# Patient Record
Sex: Male | Born: 1965 | Race: Asian | Hispanic: Refuse to answer | Marital: Married | Smoking: Never smoker
Health system: Southern US, Community
[De-identification: ages and names within clinical notes are randomized; demographics above are authoritative.]

## PROBLEM LIST (undated history)

## (undated) DIAGNOSIS — T7840XA Allergy, unspecified, initial encounter: Secondary | ICD-10-CM

## (undated) DIAGNOSIS — E119 Type 2 diabetes mellitus without complications: Secondary | ICD-10-CM

## (undated) HISTORY — DX: Allergy, unspecified, initial encounter: T78.40XA

## (undated) HISTORY — DX: Type 2 diabetes mellitus without complications: E11.9

---

## 1989-06-10 HISTORY — PX: HERNIA REPAIR: SHX51

## 2012-06-11 ENCOUNTER — Ambulatory Visit: Payer: Self-pay | Admitting: Internal Medicine

## 2012-07-11 ENCOUNTER — Ambulatory Visit: Payer: Self-pay | Admitting: Internal Medicine

## 2012-08-08 ENCOUNTER — Ambulatory Visit: Payer: Self-pay | Admitting: Internal Medicine

## 2012-10-14 ENCOUNTER — Ambulatory Visit: Payer: Self-pay | Admitting: Physician Assistant

## 2012-11-04 ENCOUNTER — Ambulatory Visit: Payer: Self-pay | Admitting: Emergency Medicine

## 2013-09-30 ENCOUNTER — Other Ambulatory Visit: Payer: Self-pay | Admitting: Physician Assistant

## 2013-09-30 LAB — CBC WITH DIFFERENTIAL/PLATELET
Basophil #: 0 10*3/uL (ref 0.0–0.1)
Basophil %: 0.5 %
EOS ABS: 0.2 10*3/uL (ref 0.0–0.7)
Eosinophil %: 2.6 %
HCT: 47 % (ref 40.0–52.0)
HGB: 15.1 g/dL (ref 13.0–18.0)
Lymphocyte #: 2.1 10*3/uL (ref 1.0–3.6)
Lymphocyte %: 29.3 %
MCH: 26.2 pg (ref 26.0–34.0)
MCHC: 32.1 g/dL (ref 32.0–36.0)
MCV: 82 fL (ref 80–100)
MONO ABS: 0.6 x10 3/mm (ref 0.2–1.0)
Monocyte %: 8.2 %
Neutrophil #: 4.2 10*3/uL (ref 1.4–6.5)
Neutrophil %: 59.4 %
PLATELETS: 260 10*3/uL (ref 150–440)
RBC: 5.77 10*6/uL (ref 4.40–5.90)
RDW: 13.3 % (ref 11.5–14.5)
WBC: 7.1 10*3/uL (ref 3.8–10.6)

## 2013-09-30 LAB — COMPREHENSIVE METABOLIC PANEL
ALK PHOS: 72 U/L
Albumin: 3.5 g/dL (ref 3.4–5.0)
Anion Gap: 5 — ABNORMAL LOW (ref 7–16)
BUN: 11 mg/dL (ref 7–18)
Bilirubin,Total: 0.4 mg/dL (ref 0.2–1.0)
CREATININE: 0.67 mg/dL (ref 0.60–1.30)
Calcium, Total: 8.5 mg/dL (ref 8.5–10.1)
Chloride: 104 mmol/L (ref 98–107)
Co2: 31 mmol/L (ref 21–32)
EGFR (African American): >60
EGFR (Non-African Amer.): >60
Glucose: 94 mg/dL (ref 65–99)
OSMOLALITY: 279 (ref 275–301)
POTASSIUM: 4.2 mmol/L (ref 3.5–5.1)
SGOT(AST): 25 U/L (ref 15–37)
SGPT (ALT): 53 U/L (ref 12–78)
SODIUM: 140 mmol/L (ref 136–145)
Total Protein: 7.8 g/dL (ref 6.4–8.2)

## 2013-09-30 LAB — LIPID PANEL
Cholesterol: 104 mg/dL (ref 0–200)
HDL Cholesterol: 31 mg/dL — ABNORMAL LOW (ref 40–60)
Ldl Cholesterol, Calc: 59 mg/dL (ref 0–100)
Triglycerides: 69 mg/dL (ref 0–200)
VLDL Cholesterol, Calc: 14 mg/dL (ref 5–40)

## 2013-09-30 LAB — IRON AND TIBC
IRON: 74 ug/dL (ref 65–175)
Iron Bind.Cap.(Total): 407 ug/dL (ref 250–450)
Iron Saturation: 18 %
UNBOUND IRON-BIND. CAP.: 333 ug/dL

## 2013-09-30 LAB — FERRITIN: Ferritin (ARMC): 38 ng/mL (ref 8–388)

## 2013-09-30 LAB — TSH: Thyroid Stimulating Horm: 1.72 u[IU]/mL

## 2014-08-04 IMAGING — US US PELVIS LIMITED
1 series · 14 of 25 positions shown · non-contrast
Comparison: none

REASON FOR EXAM: left sided testicular pain and swelling
COMMENTS:

[Series 1: us pelvis limited · 0.09mm/px · 14 of 60 slices shown]
[im 1/60]
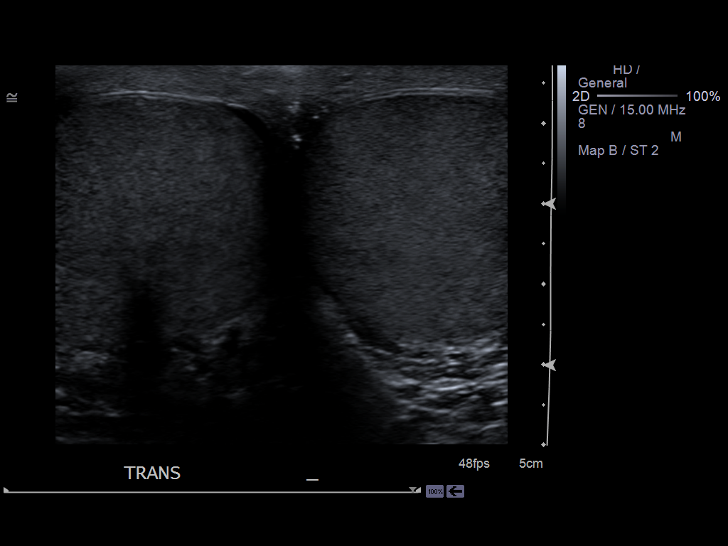
[im 5/60]
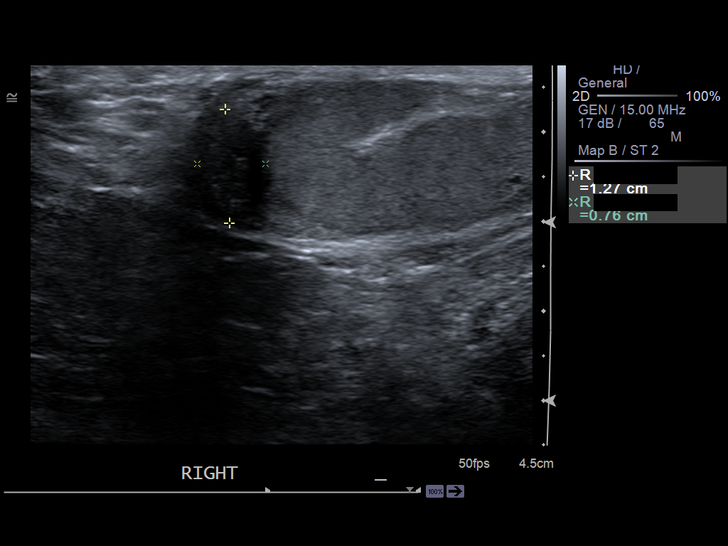
[im 10/60]
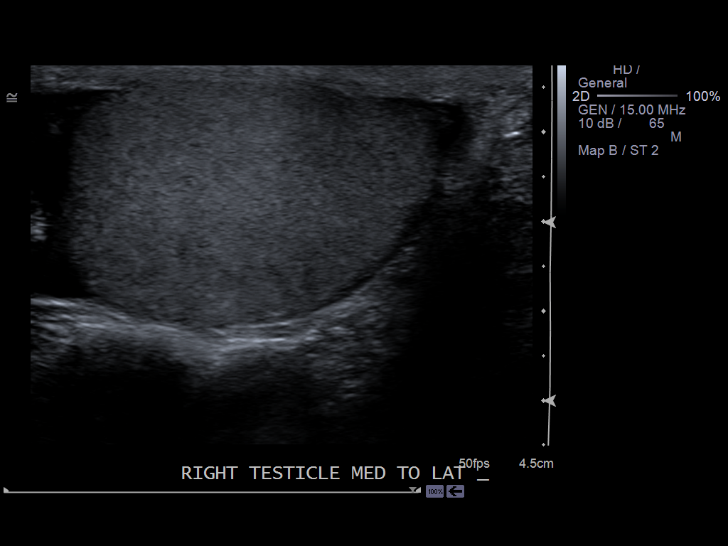
[im 15/60]
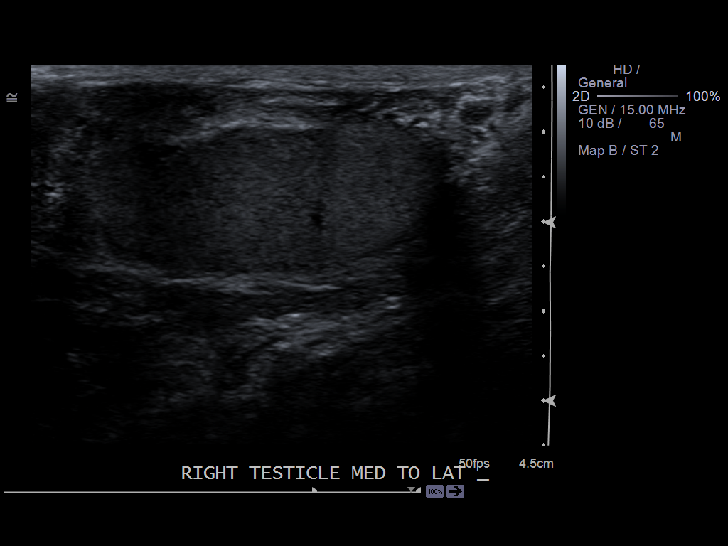
[im 20/60]
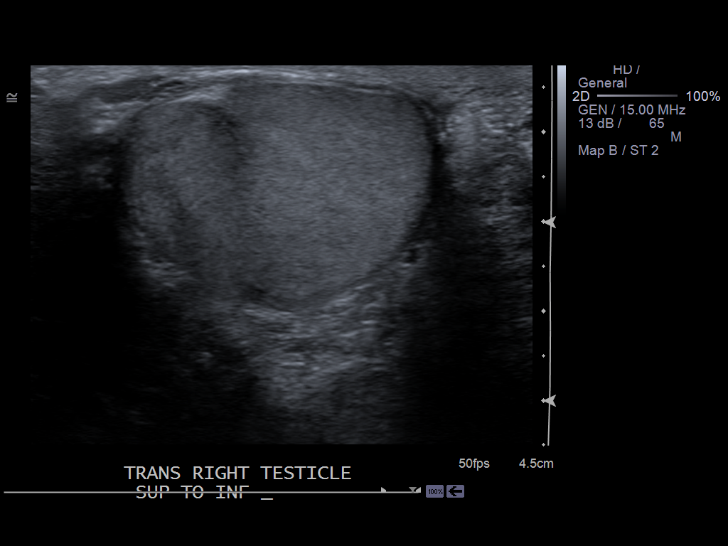
[im 23/60]
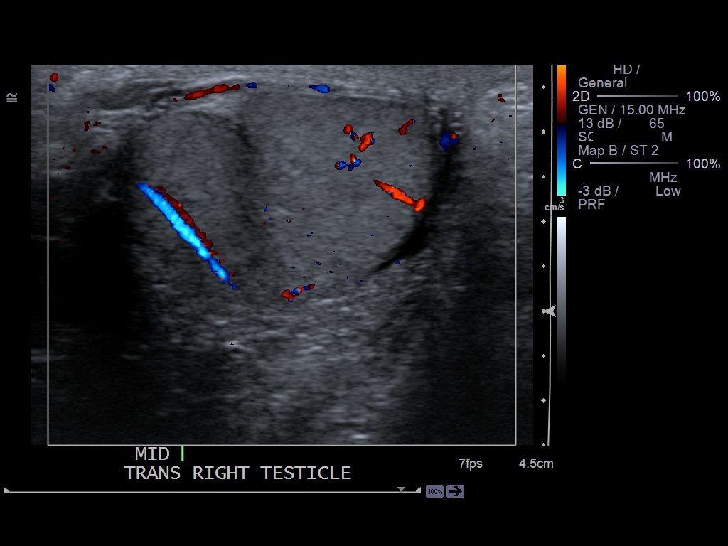
[im 28/60]
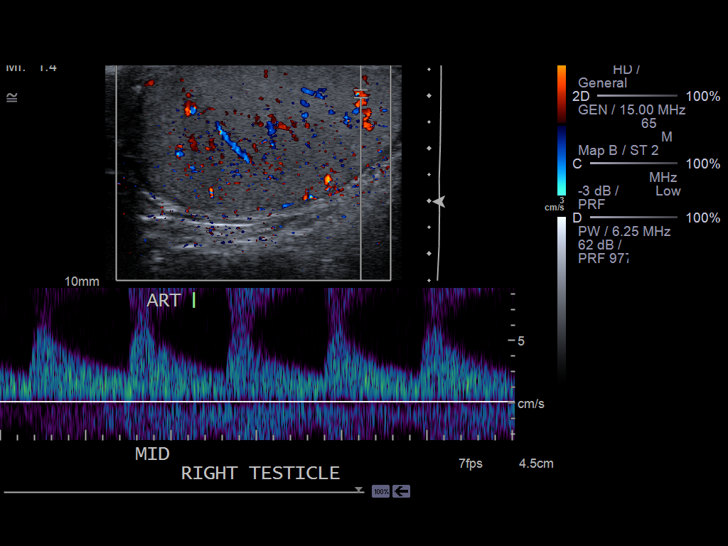
[im 32/60]
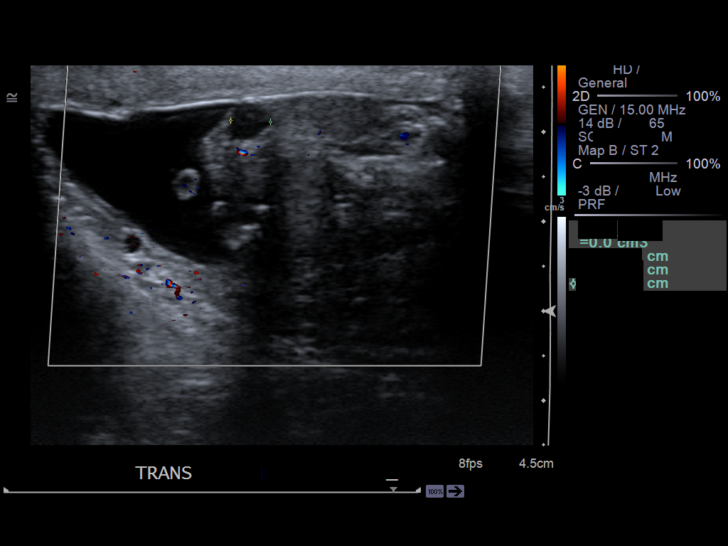
[im 37/60]
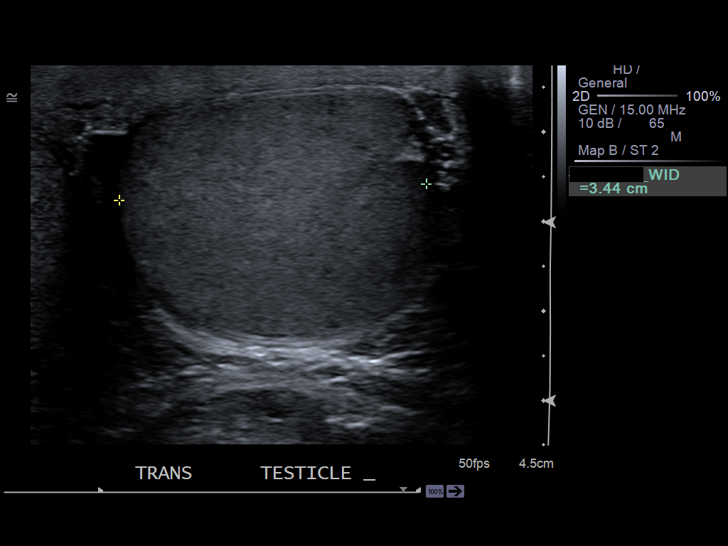
[im 40/60]
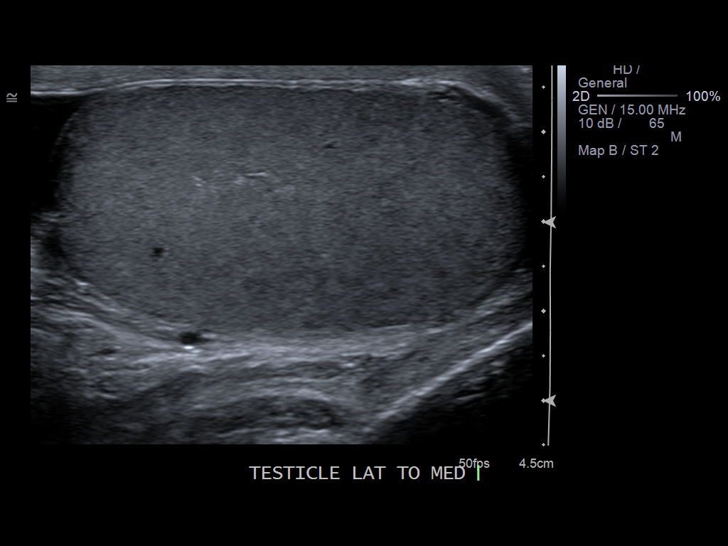
[im 45/60]
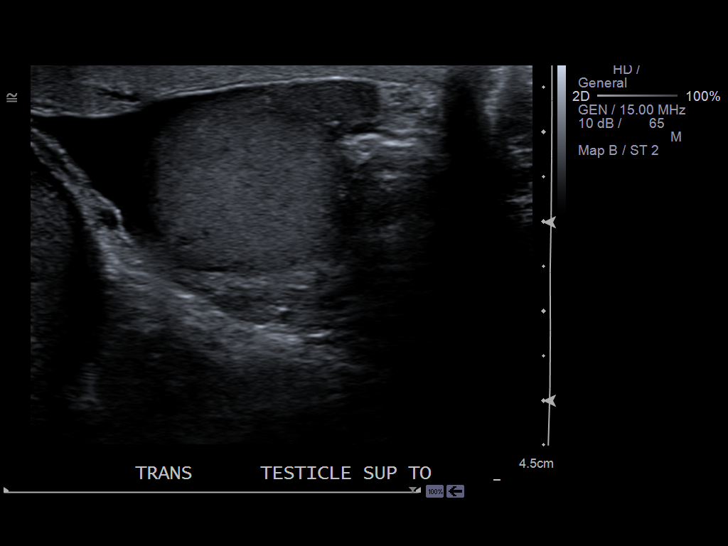
[im 50/60]
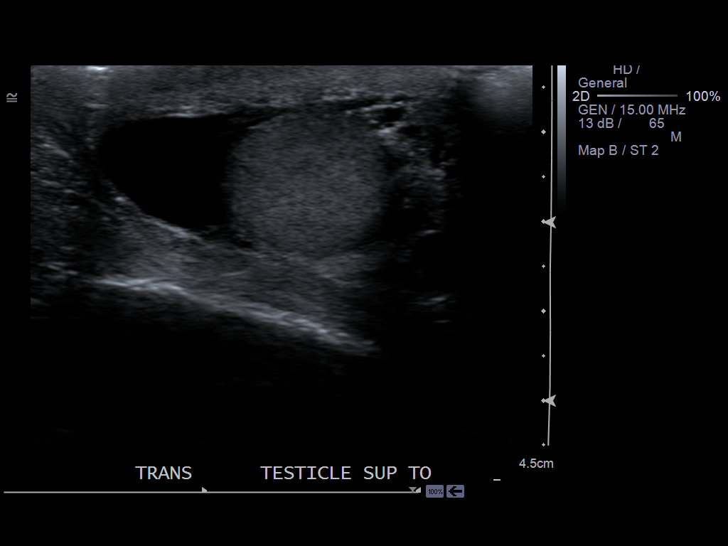
[im 55/60]
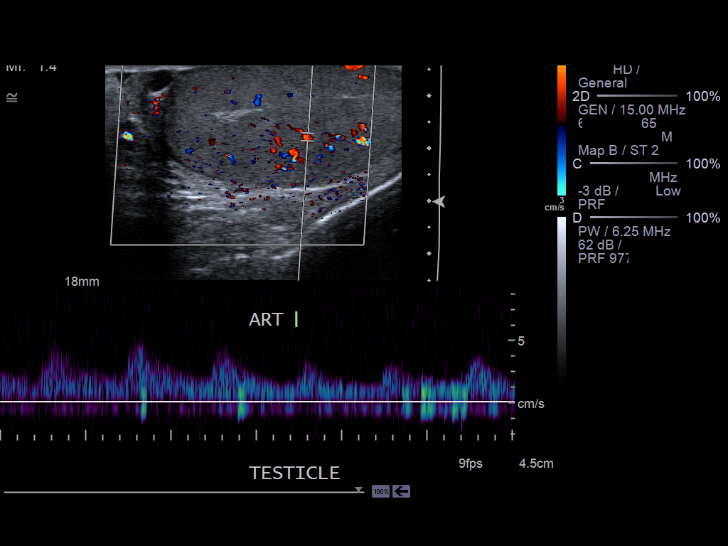
[im 60/60]
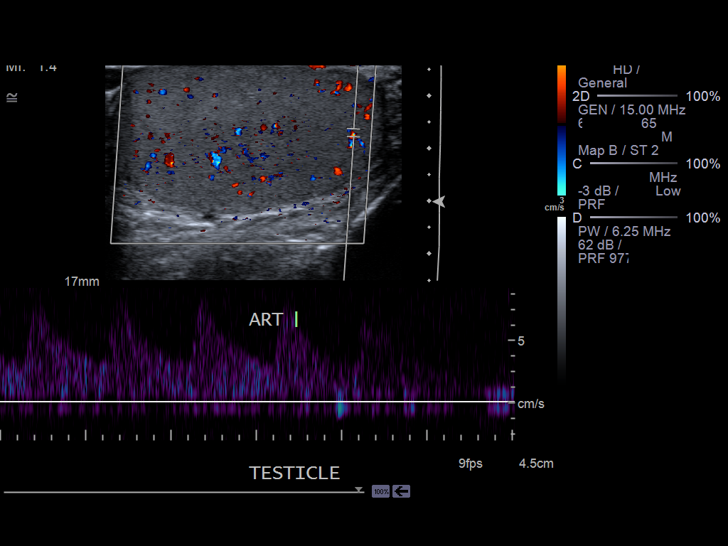

[14 of 25 positions shown; findings below may reference images not displayed]

PROCEDURE:     US  - US TESTICULAR  - October 14, 2012 [DATE]

RESULT:     Testicular ultrasound shows the right testicle measures 5.06 x
3.26 x 2.71 cm. The left testicle measures 5.49 x 3.44 x 2.62 cm. Epididymal
structures are normal in size. There is a small cyst in the left epididymis
measuring up to 5.1 mm diameter. Blood flow is documented in both testicles.
Normal appearing arterial and venous Doppler wave forms are obtained
bilaterally. There appears to be small left hydrocele. Minimal right
hydrocele is present.
IMPRESSION: 1. No evidence of testicular mass or torsion.
2. Small left epididymal cyst.
3. Minimal hydroceles left slightly greater than the right.

[REDACTED]

## 2014-08-25 IMAGING — CT CT ABD-PELV W/ CM
1 of 2 series · 15 of 32 positions shown, 19 images · IV contrast (isovue)
Comparison: none

REASON FOR EXAM: LABS 1st lower abd pain  takes Metformin
COMMENTS:

PROCEDURE:     KCT - KCT ABDOMEN/PELVIS W  - November 04, 2012  [DATE]
RESULT:     Comparison:  None
TECHNIQUE: Multiple axial images of the abdomen and pelvis were performed
from the lung bases to the pubic symphysis, with p.o. contrast and with 100
mL of Isovue 370 intravenous contrast.

[Series 2: abd 3mm w 3.0 i40f 3 · axial · 0.98mm/px · z∈[-800,-329]mm · 15 of 171 slices shown, 19 images]
[im 7/171  soft-tissue]
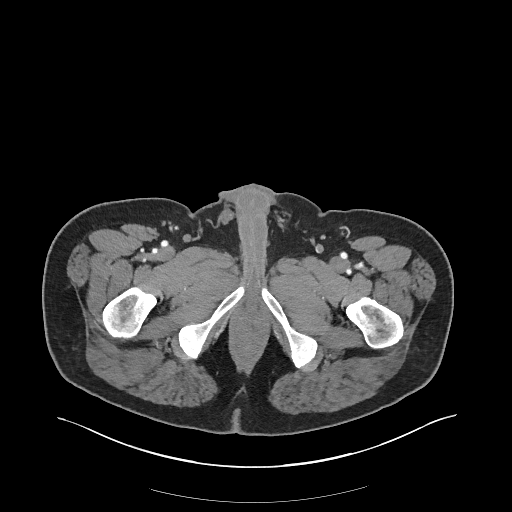
[im 7/171  bone]
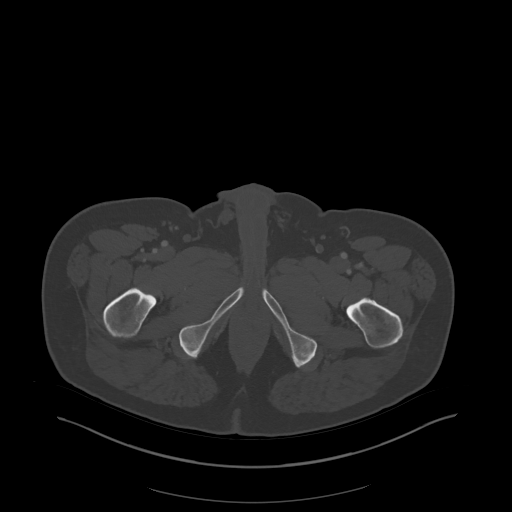
[im 21/171  soft-tissue]
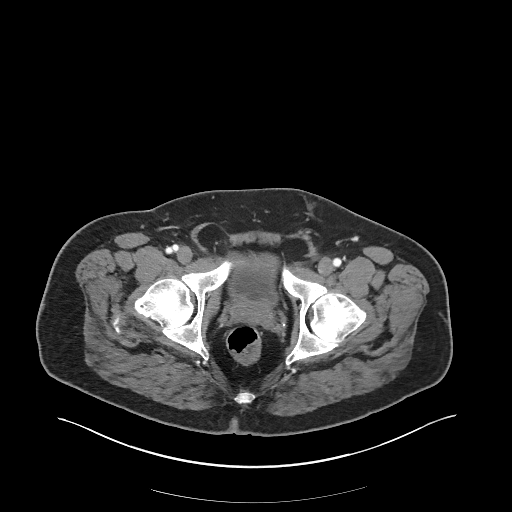
[im 35/171  soft-tissue]
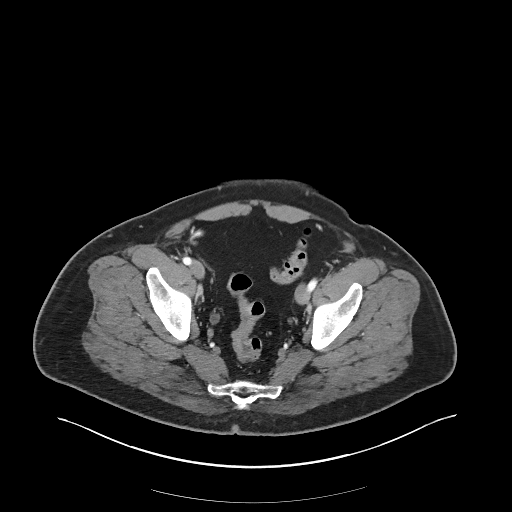
[im 48/171  soft-tissue]
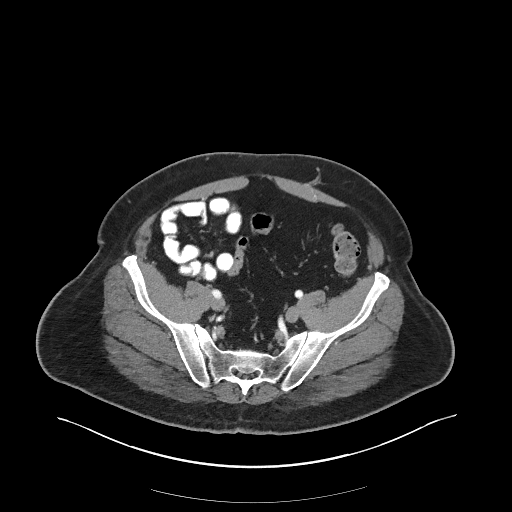
[im 62/171  soft-tissue]
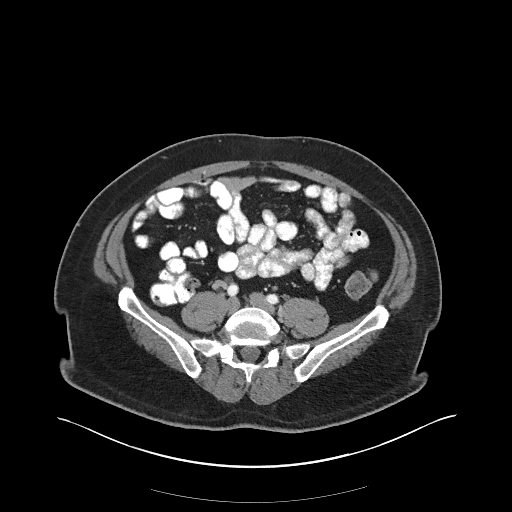
[im 75/171  soft-tissue]
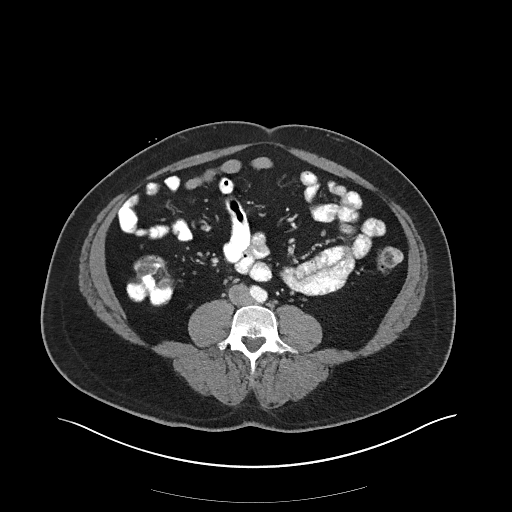
[im 89/171  soft-tissue]
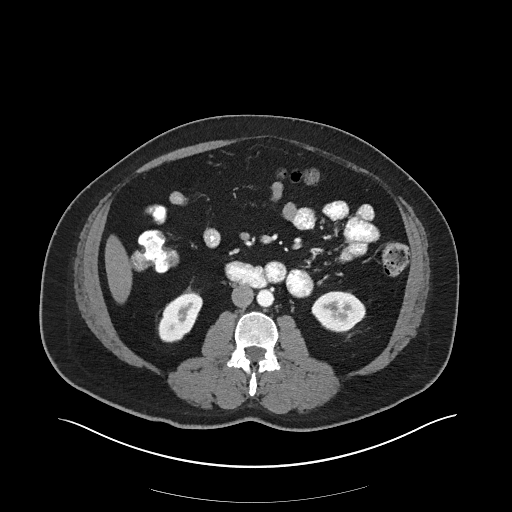
[im 96/171  soft-tissue]
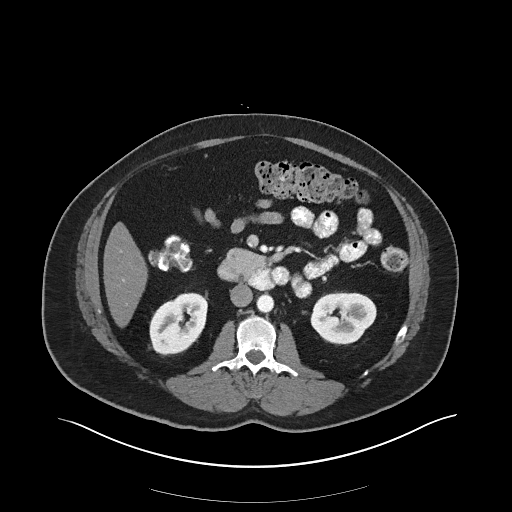
[im 109/171  soft-tissue]
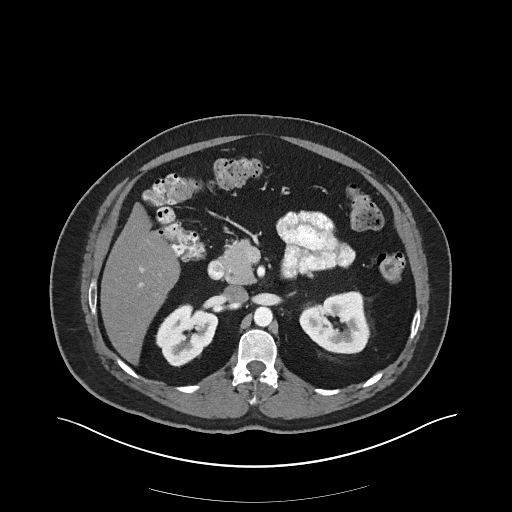
[im 109/171  bone]
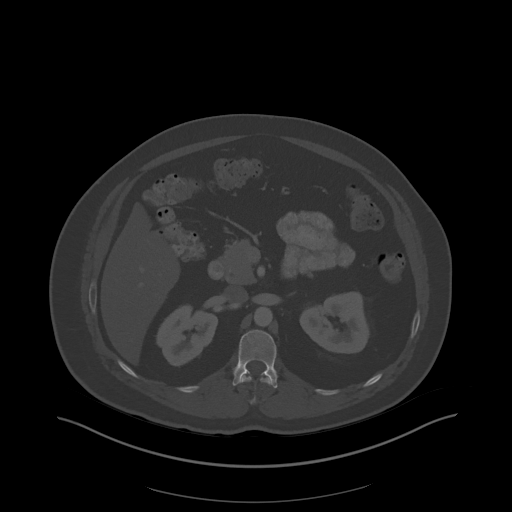
[im 123/171  soft-tissue]
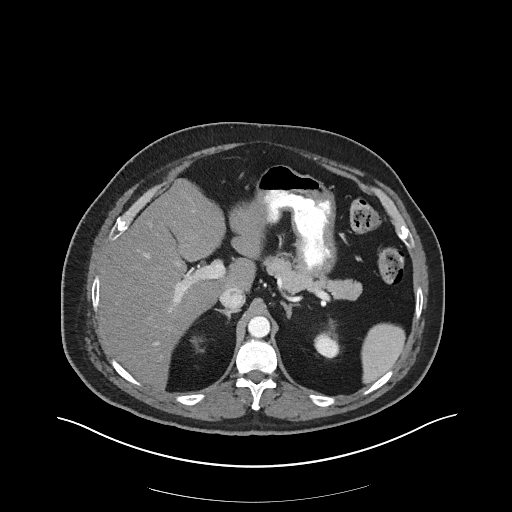
[im 137/171  soft-tissue]
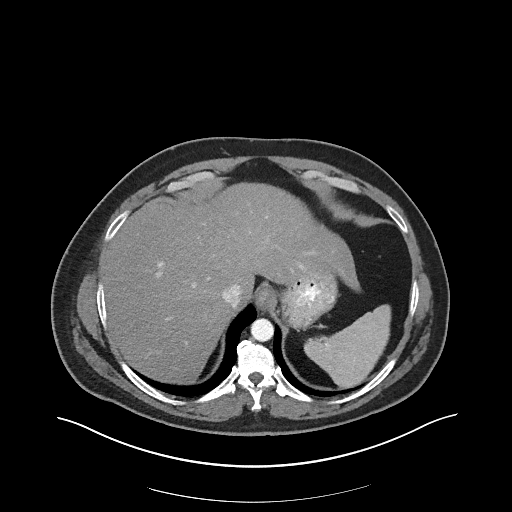
[im 143/171  lung]
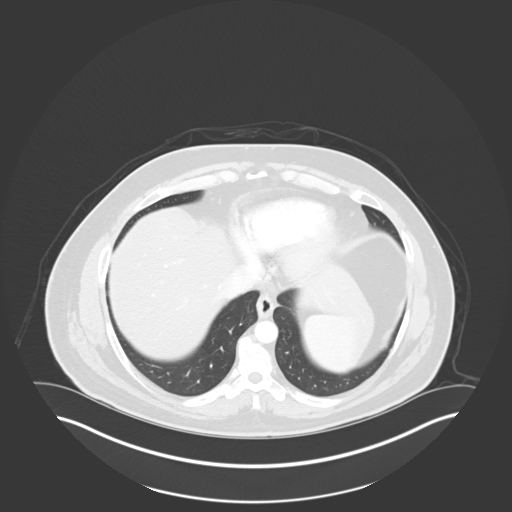
[im 150/171  soft-tissue]
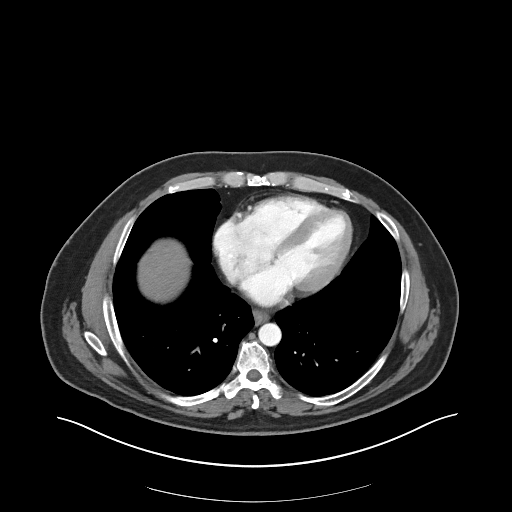
[im 150/171  lung]
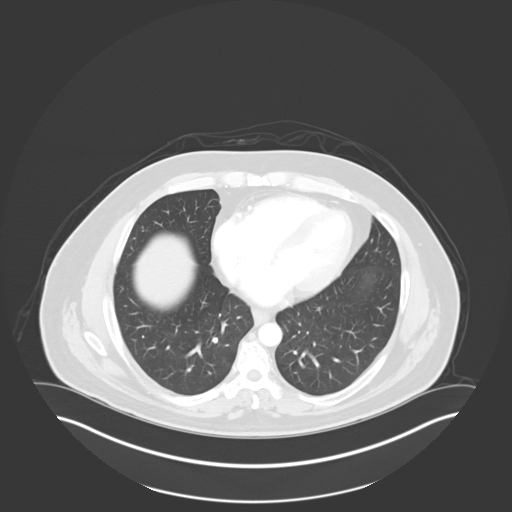
[im 157/171  lung]
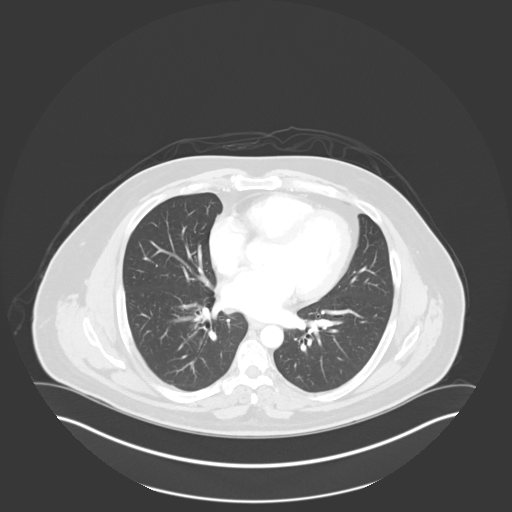
[im 164/171  soft-tissue]
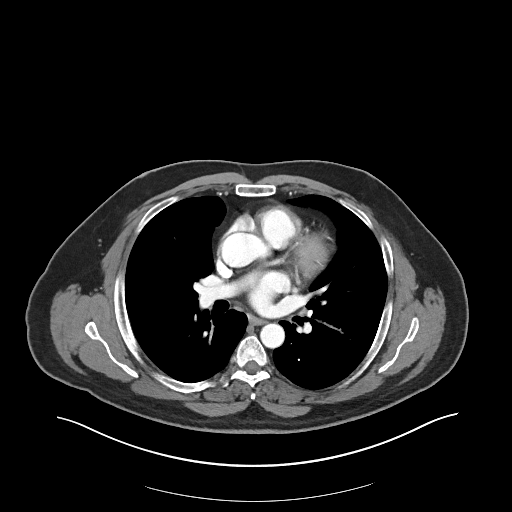
[im 164/171  lung]
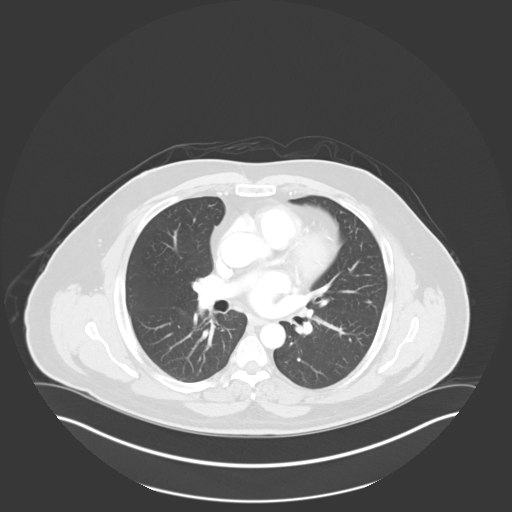

[15 of 32 positions shown; findings below may reference images not displayed]

FINDINGS: The liver is relatively low in attenuation, consistent with hepatic
steatosis. The gallbladder, spleen, adrenals, and pancreas are unremarkable.
Small low-attenuation lesion in the left kidney is too small to characterize.

The small and large bowel are normal in caliber. The appendix is normal.
There is a fat-containing right inguinal hernia. There is a very small
fat-containing periumbilical hernia.

Small sclerotic density in the left femoral head likely represents a bone
island.
IMPRESSION: 1. No acute findings in the abdomen or pelvis.
2. No fat-containing right inguinal hernia.

## 2014-09-01 LAB — CBC WITH DIFFERENTIAL/PLATELET
Basophil #: 0.1 10*3/uL (ref 0.0–0.1)
Basophil %: 0.6 %
Eosinophil #: 0.1 10*3/uL (ref 0.0–0.7)
Eosinophil %: 1.5 %
HCT: 45.7 % (ref 40.0–52.0)
HGB: 14.4 g/dL (ref 13.0–18.0)
Lymphocyte #: 1.7 10*3/uL (ref 1.0–3.6)
Lymphocyte %: 19.6 %
MCH: 25.1 pg — ABNORMAL LOW (ref 26.0–34.0)
MCHC: 31.5 g/dL — ABNORMAL LOW (ref 32.0–36.0)
MCV: 80 fL (ref 80–100)
Monocyte #: 0.5 x10 3/mm (ref 0.2–1.0)
Monocyte %: 5.8 %
Neutrophil #: 6.2 10*3/uL (ref 1.4–6.5)
Neutrophil %: 72.5 %
Platelet: 288 10*3/uL (ref 150–440)
RBC: 5.72 10*6/uL (ref 4.40–5.90)
RDW: 12.8 % (ref 11.5–14.5)
WBC: 8.6 10*3/uL (ref 3.8–10.6)

## 2014-09-01 LAB — COMPREHENSIVE METABOLIC PANEL
ALT: 15 U/L — AB
ANION GAP: 8 (ref 7–16)
Albumin: 2.4 g/dL — ABNORMAL LOW
Alkaline Phosphatase: 122 U/L
BUN: 45 mg/dL — ABNORMAL HIGH
Bilirubin,Total: 0.5 mg/dL
CREATININE: 3.23 mg/dL — AB
Calcium, Total: 9 mg/dL
Chloride: 104 mmol/L
Co2: 25 mmol/L
EGFR (African American): 25 — ABNORMAL LOW
GFR CALC NON AF AMER: 21 — AB
Glucose: 110 mg/dL — ABNORMAL HIGH
Potassium: 4.3 mmol/L
SGOT(AST): 34 U/L
Sodium: 137 mmol/L
Total Protein: 6.1 g/dL — ABNORMAL LOW

## 2014-09-01 LAB — TSH: Thyroid Stimulating Horm: 1.217 u[IU]/mL

## 2014-09-02 LAB — T4, FREE: FREE THYROXINE: 1.23 ng/dL (ref 0.76–1.46)

## 2014-09-02 LAB — PSA: PSA: 0.5 ng/mL (ref 0.0–4.0)

## 2014-09-06 ENCOUNTER — Other Ambulatory Visit: Payer: Self-pay | Admitting: Physician Assistant

## 2014-09-06 LAB — COMPREHENSIVE METABOLIC PANEL
AST: 34 U/L
Albumin: 3.9 g/dL
Alkaline Phosphatase: 66 U/L
Anion Gap: 7 (ref 7–16)
BUN: 11 mg/dL
Bilirubin,Total: 0.5 mg/dL
CALCIUM: 9.3 mg/dL
CO2: 29 mmol/L
Chloride: 102 mmol/L
Creatinine: 0.59 mg/dL — ABNORMAL LOW
EGFR (Non-African Amer.): >60
Glucose: 121 mg/dL — ABNORMAL HIGH
Potassium: 4 mmol/L
SGPT (ALT): 41 U/L
Sodium: 138 mmol/L
Total Protein: 7.3 g/dL

## 2014-09-07 ENCOUNTER — Other Ambulatory Visit
Admit: 2014-09-07 | Disposition: A | Discharge status: Home / Self Care | Payer: Self-pay | Attending: Internal Medicine | Admitting: Internal Medicine

## 2014-11-25 ENCOUNTER — Other Ambulatory Visit: Payer: Self-pay

## 2014-11-25 NOTE — Patient Outreach (Signed)
[facility]) Care Management  11/25/2014  Austin Hardin 03-Feb-1966 [id number]   I spoke to [facility] by phone today.  He tells me he started on Victoza a few weeks ago.  He had initially been on Trulicity but because it wasn't covered by the plan, he switched to Victoza.  He's giving it in his thighs- he's having no issues with administration. He is currently on 1.2mg  and goes up to 1.8mg  tomorrow. He does not complain of GI upset.  He tells me his blood sugars are 140-15mg /dl and this morning's was 156mg /dl.  Today's 2 hour post meal blood sugar was 214mg /dl.  He has a follow up appointment with MD in 1 month.   I will follow up with him- if blood sugars do not improve with increased dose of Victoza, may suggest to MD we try a SGLT2 inhibitor.  It is not covered by the healthplan but there is a coupon available that will make the cost 0 for the patient.   Susette Racer, RN, BA, MHA, CDE [facility] Diabetes Coordinator- Link To [employer] Dial:  [phone]  Fax:  [phone] E-mail: [email or web address] [address], [place], [place]  [address]

## 2015-01-18 ENCOUNTER — Other Ambulatory Visit: Payer: Self-pay

## 2015-01-18 VITALS — BP 108/80 | Ht 65.0 in | Wt 199.3 lb

## 2015-01-18 DIAGNOSIS — E1165 Type 2 diabetes mellitus with hyperglycemia: Secondary | ICD-10-CM

## 2015-01-18 DIAGNOSIS — E11649 Type 2 diabetes mellitus with hypoglycemia without coma: Secondary | ICD-10-CM | POA: Insufficient documentation

## 2015-01-18 NOTE — Patient Outreach (Signed)
[facility]) Care Management  [facility] Care Manager  01/18/2015   Farzad Westwood 06/12/1965 [id number]  Subjective: Patient tells me he has been taking Victoza 1.8mg /dl since about July, 1308- no complaints of nausea or gi discomfort. No discomfort at the injection site.  Reports blood sugars are much improved since he saw MD.    Objective: Blood sugars average around /dl fasting. Filed Vitals:   01/18/15 1643  BP: 108/80  Height: 1.651 m ( )  Weight: 199 lb 4.8 oz (90.402 kg)     Current Medications:  Current Outpatient Prescriptions  Medication Sig Dispense Refill  . aspirin 81 MG tablet Take 81 mg by mouth daily.    Marland Kitchen glyBURIDE-metformin (GLUCOVANCE) 2.5-500 MG per tablet Take 1-2 tablets by mouth daily. With largest meal    . IRON PO Take 65 mg by mouth.    . Liraglutide 18 MG/3ML SOPN Inject 1.8 mg into the skin.    Marland Kitchen omeprazole (PRILOSEC) 40 MG capsule Take 40 mg by mouth daily.     No current facility-administered medications for this visit.    Functional Status:  In your present state of health, do you have any difficulty performing the following activities: 01/18/2015  Hearing? N  Vision? N  Difficulty concentrating or making decisions? N  Walking or climbing stairs? N  Dressing or bathing? N  Doing errands, shopping? N    Fall/Depression Screening: PHQ 2/9 Scores 01/18/2015  PHQ - 2 Score 0    Assessment: Patient complying with taking medications and checking blood sugars- he does not have a plan to fit in exercise or does he have a desire to.  He has decreased weight because he has less of an appetite since Victoza dose has increased. He is willing to check blood sugars more often to aid MD in assessing blood sugar exertions.  Plan:  [facility] CM Care Plan Problem One        Patient Outreach from 01/18/2015 in [facility] [employer]   Care Plan Problem One  Elevated A1C and the potential for complications   Care Plan for Problem One   Active   [facility]N Long Term Goal (31-90 days)  Decrease A1C to less than 8.1% by the next blood sugar check with MD this fall (April 10, 2015)   [facility] Long Term Goal Start Date  01/18/15   Interventions for Problem One Long Term Goal  1. Check blood sugars 2 hours after you eat 2-3x/week   2. continue to check blood sugar each am   3. Take medications as ordered     Follow up in 1 month.   Susette Racer, RN, BA, MHA, CDE [facility] Diabetes Coordinator- Link To [employer] Dial:  [phone]  Fax:  [phone] E-mail: [email or web address] [address] [place], [place]  [address]

## 2015-02-21 ENCOUNTER — Other Ambulatory Visit: Payer: Self-pay

## 2015-02-21 NOTE — Patient Outreach (Signed)
[facility]) Care Management  02/21/2015  Austin Hardin [birth year] [id number]   Spoke to [facility] by phone. He tells me his blood sugars are consistently below /dl and he has decreased his Victoza from 1.8mg  to 1.2mg  daily last week.  He denies hypoglycemia. He reports taking BGR34, an herbal medication to lower blood sugars.   I have suggested he call his MD to see if she wants him to decrease the Victoza or decrease the Glipizide/Metformin.   Susette Racer, RN, BA, MHA, CDE [facility] Diabetes Coordinator- Link To [employer] Dial:  [phone]  Fax:  [phone] E-mail: [email or web address] [address] [place], [place]  [address]

## 2015-03-22 ENCOUNTER — Other Ambulatory Visit: Payer: Self-pay

## 2015-03-22 NOTE — Patient Outreach (Signed)
[facility](THN) Care Management  03/22/2015  Mohab Sprague 01/14/1966 [id number]  Spoke with patient by phone.  He has not made any medication changes since I last spoke to him. He denies hypoglycemia and reports blood sugars are between 110-120 mg/dl.  He has a a scheduled MD visit this fall and will see me on 04/24/15.    Susette RacerJulie Maheen Cwikla, RN, BA, MHA, CDE [facility] Diabetes Coordinator- Link To [employer] Direct Dial:  [phone]  Fax:  [phone] E-mail: [email or web address] [address], [place], [place]  [id number]

## 2015-04-24 ENCOUNTER — Ambulatory Visit: Payer: Self-pay

## 2015-05-29 ENCOUNTER — Other Ambulatory Visit: Payer: Self-pay

## 2015-05-29 VITALS — BP 130/72 | Ht 65.0 in | Wt 196.4 lb

## 2015-05-29 DIAGNOSIS — E119 Type 2 diabetes mellitus without complications: Secondary | ICD-10-CM

## 2015-05-29 LAB — POCT GLYCOSYLATED HEMOGLOBIN (HGB A1C): Hemoglobin A1C: 6.4

## 2015-05-29 NOTE — Patient Outreach (Signed)
Austin Hardin [facility]) Care Management  [place]  05/29/2015   Austin Hardin 1965-07-22 [id number]  Subjective: Patient in for his Link to Wellness visit. He has had a difficult couple of months- his father died at home, late last month and Austin Hardin has been neglecting himself.  He has only checked BG 5 times in the last 30 days.  He tells me he is taking his medications and BGR-34, a medication from [place].  He has no complaints today although he does tell me he is having some complaints of arthritis- sporadically in numerous different joints.   He has no pain now.  He uses Ibuprofen to relieve the pain.    Objective:  Filed Vitals:   05/29/15 1225  BP: 130/72  Height: 1.651 m ('5\' 5"' )  Weight: 196 lb 6.4 oz (89.086 kg)   POCT A1C 6.4%  Current Medications:  Current Outpatient Prescriptions  Medication Sig Dispense Refill  . aspirin 81 MG tablet Take 81 mg by mouth daily.    . IRON PO Take 65 mg by mouth.    . Liraglutide 18 MG/3ML SOPN Inject 1.2 mg into the skin daily.     Marland Kitchen omeprazole (PRILOSEC) 40 MG capsule Take 40 mg by mouth as needed. Reported on 05/29/2015    . glyBURIDE-metformin (GLUCOVANCE) 2.5-500 MG per tablet Take 1 tablet by mouth daily. With largest meal     No current facility-administered medications for this visit.    Functional Status:  In your present state of health, do you have any difficulty performing the following activities: 05/29/2015 01/18/2015  Hearing? N N  Vision? N N  Difficulty concentrating or making decisions? N N  Walking or climbing stairs? N N  Dressing or bathing? N N  Doing errands, shopping? N N    Fall/Depression Screening: PHQ 2/9 Scores 05/29/2015 01/18/2015  PHQ - 2 Score 0 0    Assessment: Austin Hardin has neglected checking his BG over the past few months- he is taking his medications and wearing a fitbit to work. His A1C is 6.4%- he has decreased his Victoza to 1.74m as requested by ELeta Baptistand he takes his BGR  daily.  He denies hypo or hyperglycemia.  He does not exercise but has lost 3lbs since 01/2015, 11.4lbs since last year.  Plan: Patient has committed to checking his blood sugar daily- continue to eat 3 meals per day.   THN CM Care Plan Problem One        Most Recent Value   Care Plan Problem One  Elevated A1C and the potential for complications   Role Documenting the Problem One  Care Management [place] Problem [place]  Active   THN Long Term Goal Start Date  01/18/15   THN Long Term Goal Met Date  05/29/15    [facility] Care [facility] Problem Two        Most Recent Value   Care Plan Problem Two  Potential for elevated A1C   Role Documenting the Problem Two  Care Management Coordinator   Care Plan for Problem Two  Active   Interventions for Problem Two Long Term Goal   1. check sugar daily  2. aim for 10,000 steps of exercise per day  3. Take meds as ordered    THN Long Term Goal (31-90) days  Patient will maintain A1C less than 7% when checked in 3 months.    THN Long Term Goal Start Date  05/29/15  Follow up in 3 months.   Gentry Fitz, RN, BA, [place], [place] Direct Dial:  [phone]  Fax:  [phone] E-mail: [email or web address] [address] [place], [place]  [address]

## 2015-05-30 ENCOUNTER — Ambulatory Visit: Payer: Self-pay

## 2015-11-09 DIAGNOSIS — H524 Presbyopia: Secondary | ICD-10-CM | POA: Diagnosis not present

## 2015-11-13 ENCOUNTER — Ambulatory Visit: Payer: Self-pay

## 2015-11-13 NOTE — Patient Outreach (Signed)
[facility]N) Care Management  11/13/2015  Austin Hardin 12/28/1965 [id number]   [name] did not show for this scheduled LTW appointment today.  This is the second appointment he has missed (scheduled on 08/21/15 @ noon but he was in [place]).  He has not reached out to reschedule (I rescheduled this appointment as he did not reach out to re-schedule the March appointment.)  I will send him a reschedule letter.   Susette RacerJulie Taiya Nutting, RN, BA, MHA, CDE [facility] Diabetes Coordinator- Link To [employer] Direct Dial:  [phone]  Fax:  [phone] E-mail: [email or web address] [address], [place], [place]  [id number]

## 2015-11-17 DIAGNOSIS — M545 Low back pain: Secondary | ICD-10-CM | POA: Diagnosis not present

## 2015-11-27 ENCOUNTER — Ambulatory Visit: Payer: Self-pay

## 2015-11-27 ENCOUNTER — Other Ambulatory Visit: Payer: Self-pay

## 2015-11-27 DIAGNOSIS — E1165 Type 2 diabetes mellitus with hyperglycemia: Secondary | ICD-10-CM

## 2015-11-27 LAB — POCT GLYCOSYLATED HEMOGLOBIN (HGB A1C): Hemoglobin A1C: 7.3

## 2015-11-27 NOTE — Patient Outreach (Signed)
[facility]N) Care Management  [facility]  11/27/2015   Austin Hardin 10/29/1965 [id number]  Subjective: Austin Hardin was in for his regularly scheduled Link to Wellness visit.  He brought his meter and is checking sugars about once a day, usually fasting- CBG 99-158mg /dl and has only checked once in the afternoon, CBG was 196mg /dl but he did not feel odd. He works very long hours from [employer]am to 11pm and has only been getting about 08-3998 steps per day. He is not taking Glucovance as ordered- he's only taking 1 tablet a day, not 2 as ordered. He's taking 1.2mg  of Victoza as ordered.   Objective:  Filed Vitals:   11/27/15 1318  BP: 130/82  Pulse: 69  Height: 1.651 m (5\' 5" )  SpO2: 98%   POCT A1C 7.3%  Encounter Medications:  Outpatient Encounter Prescriptions as of 11/27/2015  Medication Sig  . aspirin 81 MG tablet Take 81 mg by mouth daily.  Marland Kitchen. glyBURIDE-metformin (GLUCOVANCE) 2.5-500 MG per tablet Take 1 tablet by mouth daily. With largest meal  . IRON PO Take 65 mg by mouth.  . Liraglutide 18 MG/3ML SOPN Inject 1.2 mg into the skin daily.   Marland Kitchen. omeprazole (PRILOSEC) 40 MG capsule Take 40 mg by mouth as needed. Reported on 11/27/2015   No facility-administered encounter medications on file as of 11/27/2015.    Functional Status:  In your present state of health, do you have any difficulty performing the following activities: 11/27/2015 05/29/2015  Hearing? N N  Vision? N N  Difficulty concentrating or making decisions? N N  Walking or climbing stairs? N N  Dressing or bathing? N N  Doing errands, shopping? N N    Fall/Depression Screening: PHQ 2/9 Scores 11/27/2015 05/29/2015 01/18/2015  PHQ - 2 Score 0 0 0    Assessment: A1C up slightly since December when I last saw him. He was in [place] for a few weeks when he took his dad's ashes home and he ate a lot of sweets.  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  He eats peanut butter  crackers for breakfast and lunch and dinner at home.  He reports great stress.  Plan:  [facility] CM Care Plan Problem One        Most Recent Value   Care Plan Problem One  Elevated A1C and the potential for complications   Role Documenting the Problem One  Care Management Coordinator   Care Plan for Problem One  Active   THN Long Term Goal (31-90 days)  Decrease A1C to less than 8.1% by the next A1C check in 3 months.    THN Long Term Goal Start Date  11/27/15 [facility][A1C went to 6.4% then back up to 7.3%]   Interventions for Problem One Long Term Goal  1. Check blood sugars 2 hours after you eat 2-3x/week   2.. Take medications as ordered 3. aim for 6000 steps per day     I will follow up with him in 1 month.    [name], RN, BA, MHA, CDE Diabetes Coordinator Inpatient Diabetes Program  [phone] (Team Pager) [phone] [facility] Office) 11/27/2015 2:30 PM

## 2015-11-28 ENCOUNTER — Ambulatory Visit: Payer: Self-pay

## 2015-11-29 ENCOUNTER — Ambulatory Visit: Payer: Self-pay

## 2015-11-30 ENCOUNTER — Ambulatory Visit: Payer: Self-pay

## 2016-02-21 ENCOUNTER — Ambulatory Visit: Payer: Self-pay

## 2016-03-20 ENCOUNTER — Other Ambulatory Visit: Payer: Self-pay

## 2016-03-20 VITALS — BP 118/68 | Ht 65.0 in | Wt 201.8 lb

## 2016-03-20 DIAGNOSIS — E1165 Type 2 diabetes mellitus with hyperglycemia: Secondary | ICD-10-CM

## 2016-03-20 NOTE — Patient Outreach (Signed)
[facility] Care Management  [place]  03/20/2016   Austin Hardin 05-25-1966 [id number]  Subjective: Met with Austin Hardin in the Link to Wellness diabetes support program.  His last A1C was 7.3% in June 2017. He is only checking fasting blood sugars which range from 106-146m/dl.  He is not checking 2 hours post meal although he sets this as a goal at our last visit.  His 14 day blood sugar average is 1415mdl but he's only checked 6 times.  His 30 day average is 13891ml (10 readings) He has no complaints of pain since stopping the [place] bgr-34.  He takes Glimiperide/Metformin only once a day and Victoza 1.2mg36my.  He denies hypo and hyperglycemia, denies chest pain or SOB.   Objective:  Vitals:   03/20/16 1320  BP: 118/68  Weight: 201 lb 12.8 oz (91.5 kg)  Height: 1.651 m ('5\' 5"' )     Encounter Medications:  Outpatient Encounter Prescriptions as of 03/20/2016  Medication Sig  . aspirin 81 MG tablet Take 81 mg by mouth daily.  . glMarland KitchenBURIDE-metformin (GLUCOVANCE) 2.5-500 MG per tablet Take 1 tablet by mouth daily. With largest meal  . IRON PO Take 65 mg by mouth.  . Liraglutide 18 MG/3ML SOPN Inject 1.2 mg into the skin daily.   . omMarland Kitchenprazole (PRILOSEC) 40 MG capsule Take 40 mg by mouth as needed. Reported on 11/27/2015   No facility-administered encounter medications on file as of 03/20/2016.     Functional Status:  In your present state of health, do you have any difficulty performing the following activities: 11/27/2015 05/29/2015  Hearing? N N  Vision? N N  Difficulty concentrating or making decisions? N N  Walking or climbing stairs? N N  Dressing or bathing? N N  Doing errands, shopping? N N  Some recent data might be hidden    Fall/Depression Screening: PHQ 2/9 Scores 03/20/2016 11/27/2015 05/29/2015 01/18/2015  PHQ - 2 Score 0 0 0 0    Assessment: Austin Hardin is less engaged with his care than in the past. He is checking less and does not  exercise because of his job.   We discussed the WellAnawalttform for Link to Wellness in 2018 which may help with engagement. He needs to schedule a follow up MD visit and has scheduled his dilated eye exam for November.   Plan:  [facility] Problem One   Flowsheet Row Most Recent Value  Care Plan Problem One  Elevated A1C and the potential for complications  Role Documenting the Problem One  Care Management [place] Problem One  Active  THN Long Term Goal (31-90 days)  Patient will decrease and maintain A1C to 7% or below  [facility] Term Goal Start Date  03/20/16  Interventions for Problem One Long Term Goal  1. Discussed the need for consistent blood sugar checks 2. Discussed the relationship between elevated CBG/A1C and the potential for complications  3.  Reviewed administration of Victoza  4.  Discussed the importance of scheduled MD visit (needed) - he has scheduled dilated eye exam for laye November      Follow up in early 2018.   JuliGentry Hardin, BA, MHA,[place] Dial:  [phone]:  [phone]: [email or web address] [address] [place]  [phone]

## 2016-04-16 DIAGNOSIS — E1165 Type 2 diabetes mellitus with hyperglycemia: Secondary | ICD-10-CM | POA: Diagnosis not present

## 2016-04-16 DIAGNOSIS — D509 Iron deficiency anemia, unspecified: Secondary | ICD-10-CM | POA: Diagnosis not present

## 2016-04-16 DIAGNOSIS — I83893 Varicose veins of bilateral lower extremities with other complications: Secondary | ICD-10-CM | POA: Diagnosis not present

## 2016-04-23 ENCOUNTER — Other Ambulatory Visit
Admission: RE | Admit: 2016-04-23 | Discharge: 2016-04-23 | Disposition: A | Discharge status: Home / Self Care | Payer: 59 | Source: Ambulatory Visit | Attending: Physician Assistant | Admitting: Physician Assistant

## 2016-04-23 DIAGNOSIS — E611 Iron deficiency: Secondary | ICD-10-CM | POA: Diagnosis not present

## 2016-04-23 DIAGNOSIS — E119 Type 2 diabetes mellitus without complications: Secondary | ICD-10-CM | POA: Diagnosis not present

## 2016-04-23 LAB — COMPREHENSIVE METABOLIC PANEL
ALBUMIN: 4.3 g/dL (ref 3.5–5.0)
ALK PHOS: 64 U/L (ref 38–126)
ALT: 40 U/L (ref 17–63)
ANION GAP: 7 (ref 5–15)
AST: 32 U/L (ref 15–41)
BUN: 13 mg/dL (ref 6–20)
CALCIUM: 9.5 mg/dL (ref 8.9–10.3)
CO2: 28 mmol/L (ref 22–32)
Chloride: 103 mmol/L (ref 101–111)
Creatinine, Ser: 0.56 mg/dL — ABNORMAL LOW (ref 0.61–1.24)
GFR calc Af Amer: >60 mL/min (ref >60)
GLUCOSE: 119 mg/dL — AB (ref 65–99)
Potassium: 4.5 mmol/L (ref 3.5–5.1)
Sodium: 138 mmol/L (ref 135–145)
Total Bilirubin: 0.7 mg/dL (ref 0.3–1.2)
Total Protein: 7.5 g/dL (ref 6.5–8.1)

## 2016-04-23 LAB — CBC
HEMATOCRIT: 46.7 % (ref 40.0–52.0)
Hemoglobin: 15.6 g/dL (ref 13.0–18.0)
MCH: 26.5 pg (ref 26.0–34.0)
MCHC: 33.5 g/dL (ref 32.0–36.0)
MCV: 79.3 fL — AB (ref 80.0–100.0)
Platelets: 293 10*3/uL (ref 150–440)
RBC: 5.9 MIL/uL (ref 4.40–5.90)
RDW: 13.2 % (ref 11.5–14.5)
WBC: 9.6 10*3/uL (ref 3.8–10.6)

## 2016-04-23 LAB — LIPID PANEL
CHOLESTEROL: 133 mg/dL (ref 0–200)
HDL: 37 mg/dL — ABNORMAL LOW (ref >40)
LDL Cholesterol: 85 mg/dL (ref 0–99)
Total CHOL/HDL Ratio: 3.6 RATIO
Triglycerides: 57 mg/dL (ref <150)
VLDL: 11 mg/dL (ref 0–40)

## 2016-04-23 LAB — IRON AND TIBC
Iron: 101 ug/dL (ref 45–182)
SATURATION RATIOS: 23 % (ref 17.9–39.5)
TIBC: 447 ug/dL (ref 250–450)
UIBC: 346 ug/dL

## 2016-04-23 LAB — FERRITIN: Ferritin: 49 ng/mL (ref 24–336)

## 2016-04-23 LAB — PSA: PSA: 0.82 ng/mL (ref 0.00–4.00)

## 2016-04-23 LAB — TSH: TSH: 1.733 u[IU]/mL (ref 0.350–4.500)

## 2016-04-24 LAB — T4: T4 TOTAL: 6.9 ug/dL (ref 4.5–12.0)

## 2016-10-14 DIAGNOSIS — E119 Type 2 diabetes mellitus without complications: Secondary | ICD-10-CM | POA: Diagnosis not present

## 2016-10-14 DIAGNOSIS — K219 Gastro-esophageal reflux disease without esophagitis: Secondary | ICD-10-CM | POA: Diagnosis not present

## 2016-10-14 DIAGNOSIS — Z0001 Encounter for general adult medical examination with abnormal findings: Secondary | ICD-10-CM | POA: Diagnosis not present

## 2017-01-15 DIAGNOSIS — H52223 Regular astigmatism, bilateral: Secondary | ICD-10-CM | POA: Diagnosis not present

## 2017-01-15 DIAGNOSIS — H5213 Myopia, bilateral: Secondary | ICD-10-CM | POA: Diagnosis not present

## 2017-01-15 DIAGNOSIS — E119 Type 2 diabetes mellitus without complications: Secondary | ICD-10-CM | POA: Diagnosis not present

## 2017-04-09 DIAGNOSIS — J309 Allergic rhinitis, unspecified: Secondary | ICD-10-CM | POA: Diagnosis not present

## 2017-04-09 DIAGNOSIS — R030 Elevated blood-pressure reading, without diagnosis of hypertension: Secondary | ICD-10-CM | POA: Diagnosis not present

## 2017-04-09 DIAGNOSIS — E1165 Type 2 diabetes mellitus with hyperglycemia: Secondary | ICD-10-CM | POA: Diagnosis not present

## 2017-04-09 DIAGNOSIS — J019 Acute sinusitis, unspecified: Secondary | ICD-10-CM | POA: Diagnosis not present

## 2017-05-07 NOTE — Patient Outreach (Signed)
[facility]N) Care Management  05/07/2017  Creston Carne 08/28/1965 [id number]   I have removed myself from the care team for Link to Wellness. I will continue to assist [name] in the [facility] app called [employer].   Susette RacerJulie Leeandre Nordling, RN, BA, MHA, CDE [facility] Diabetes Coordinator- Link To [employer] Direct Dial:  [phone]  Fax:  [phone] E-mail: [email or web address] [address], [place], [place]  [id number]

## 2017-05-27 ENCOUNTER — Other Ambulatory Visit: Payer: Self-pay | Admitting: Nurse Practitioner

## 2017-05-27 MED ORDER — ERGOCALCIFEROL 1.25 MG (50000 UT) PO CAPS: 50000.0000 [IU] | ORAL_CAPSULE | ORAL | 2 refills | Status: DC

## 2017-05-28 ENCOUNTER — Other Ambulatory Visit: Payer: Self-pay

## 2017-05-28 MED ORDER — ERGOCALCIFEROL 1.25 MG (50000 UT) PO CAPS: 50000.0000 [IU] | ORAL_CAPSULE | ORAL | 2 refills | Status: DC

## 2017-08-11 ENCOUNTER — Other Ambulatory Visit: Payer: Self-pay

## 2017-08-11 MED ORDER — GLYBURIDE-METFORMIN 2.5-500 MG PO TABS: 1.0000 | ORAL_TABLET | Freq: Two times a day (BID) | ORAL | 1 refills | Status: DC

## 2017-11-07 ENCOUNTER — Ambulatory Visit: Payer: 59 | Admitting: Nurse Practitioner

## 2017-11-07 ENCOUNTER — Encounter: Payer: Self-pay | Admitting: Nurse Practitioner

## 2017-11-07 VITALS — BP 130/84 | HR 75 | Resp 16 | Ht 65.0 in | Wt 202.4 lb

## 2017-11-07 DIAGNOSIS — E11649 Type 2 diabetes mellitus with hypoglycemia without coma: Secondary | ICD-10-CM | POA: Diagnosis not present

## 2017-11-07 DIAGNOSIS — K219 Gastro-esophageal reflux disease without esophagitis: Secondary | ICD-10-CM

## 2017-11-07 DIAGNOSIS — Z0001 Encounter for general adult medical examination with abnormal findings: Secondary | ICD-10-CM

## 2017-11-07 LAB — POCT GLYCOSYLATED HEMOGLOBIN (HGB A1C): Hemoglobin A1C: 7.9 % — AB (ref 4.0–5.6)

## 2017-11-07 MED ORDER — OMEPRAZOLE 40 MG PO CPDR
40.0000 mg | DELAYED_RELEASE_CAPSULE | ORAL | 5 refills | Status: DC | PRN
Start: 2017-11-07 — End: 2018-03-10

## 2017-11-07 MED ORDER — GLYBURIDE-METFORMIN 2.5-500 MG PO TABS: 1.0000 | ORAL_TABLET | Freq: Two times a day (BID) | ORAL | 1 refills | Status: DC

## 2017-11-07 MED ORDER — LIRAGLUTIDE 18 MG/3ML ~~LOC~~ SOPN: 1.2000 mg | PEN_INJECTOR | Freq: Every day | SUBCUTANEOUS | 5 refills | Status: DC

## 2017-11-07 NOTE — Progress Notes (Signed)
[facility] [place], [place] [address]  Internal MEDICINE  Office Visit Note  Patient Name: Austin Hardin  [id number]  [id number]  Date of Service: 11/26/2017  Chief Complaint  Patient presents with  . Annual Exam  . Diabetes     Diabetes  He presents for his follow-up diabetic visit. He has type 2 diabetes mellitus. His disease course has been stable. There are no hypoglycemic associated symptoms. Pertinent negatives for hypoglycemia include no dizziness, headaches, nervousness/anxiousness or tremors. There are no diabetic associated symptoms. Pertinent negatives for diabetes include no chest pain and no fatigue. There are no hypoglycemic complications. Symptoms are stable. There are no diabetic complications. Risk factors for coronary artery disease include family history. Current diabetic treatment includes oral agent (monotherapy) (victoza). He is compliant with treatment all of the time. His weight is stable. He is following a generally healthy diet. Meal planning includes avoidance of concentrated sweets. He has not had a previous visit with a dietitian. He participates in exercise daily. There is no change in his home blood glucose trend. An ACE inhibitor/angiotensin II receptor blocker is not being taken. He does not see a podiatrist.Eye exam is current.   Pt is here for routine health maintenance examination  Current Medication: Outpatient Encounter Medications as of 11/07/2017  Medication Sig  . aspirin 81 MG tablet Take 81 mg by mouth daily.  . ergocalciferol (DRISDOL) 50000 units capsule Take 1 capsule (50,000 Units total) by mouth once a week.  . glyBURIDE-metformin (GLUCOVANCE) 2.5-500 MG tablet Take 1 tablet by mouth 2 (two) times daily. With largest meal  . IRON PO Take 65 mg by mouth.  . liraglutide (VICTOZA) 18 MG/3ML SOPN Inject 0.2 mLs (1.2 mg total) into the skin daily.  Marland Kitchen omeprazole (PRILOSEC) 40 MG capsule Take 1 capsule (40 mg total)  by mouth as needed. Reported on 11/27/2015  . [DISCONTINUED] glyBURIDE-metformin (GLUCOVANCE) 2.5-500 MG tablet Take 1 tablet by mouth 2 (two) times daily. With largest meal  . [DISCONTINUED] Liraglutide 18 MG/3ML SOPN Inject 1.2 mg into the skin daily.   . [DISCONTINUED] omeprazole (PRILOSEC) 40 MG capsule Take 40 mg by mouth as needed. Reported on 11/27/2015   No facility-administered encounter medications on file as of 11/07/2017.     Surgical History: Past Surgical History:  Procedure Laterality Date  . HERNIA REPAIR  1991    Medical History: Past Medical History:  Diagnosis Date  . Allergy    environmental  . Diabetes mellitus without complication ([facility])     Family History: Family History  Problem Relation Age of Onset  . Diabetes Mother   . Hyperlipidemia Mother   . Hypertension Mother   . Cancer Father   . Diabetes Father   . Hyperlipidemia Father   . Hypertension Father   . Heart disease Father   . Diabetes Brother   . Diabetes Brother       Review of Systems  Constitutional: Negative for activity change, chills, fatigue and unexpected weight change.  HENT: Negative for congestion, postnasal drip, rhinorrhea, sneezing and sore throat.   Eyes: Negative.  Negative for redness.  Respiratory: Negative for cough, chest tightness, shortness of breath and wheezing.   Cardiovascular: Negative for chest pain and palpitations.  Gastrointestinal: Negative for abdominal pain, constipation, diarrhea and nausea.  Endocrine:       Blood sugars doing well   Genitourinary: Negative for dysuria and frequency.  Musculoskeletal: Negative for arthralgias, back pain, joint swelling and neck pain.  Skin: Negative for rash.  Allergic/Immunologic: Negative for environmental allergies.  Neurological: Negative for dizziness, tremors, numbness and headaches.  Hematological: Negative for adenopathy. Does not bruise/bleed easily.  Psychiatric/Behavioral: Negative for behavioral problems  (Depression), sleep disturbance and suicidal ideas. The patient is not nervous/anxious.      Today's Vitals   11/07/17 1155  BP: 130/84  Pulse: 75  Resp: 16  SpO2: 94%  Weight: 202 lb 6.4 oz (91.8 kg)  Height: '5\' 5"'  (1.651 m)    Physical Exam  Constitutional: He is oriented to person, place, and time. He appears well-developed and well-nourished. No distress.  HENT:  Head: Normocephalic and atraumatic.  Nose: Nose normal.  Mouth/Throat: Oropharynx is clear and moist. No oropharyngeal exudate.  Eyes: Pupils are equal, round, and reactive to light. Conjunctivae and EOM are normal.  Neck: Normal range of motion. Neck supple. No JVD present. No tracheal deviation present. No thyromegaly present.  Cardiovascular: Normal rate, regular rhythm, normal heart sounds and intact distal pulses. Exam reveals no gallop and no friction rub.  No murmur heard. Pulses:      Dorsalis pedis pulses are 2+ on the right side, and 2+ on the left side.  Pulmonary/Chest: Effort normal and breath sounds normal. No respiratory distress. He has no wheezes. He has no rales. He exhibits no tenderness.  Abdominal: Soft. Bowel sounds are normal. There is no tenderness.  Musculoskeletal: Normal range of motion.  Feet:  Right Foot:  Protective Sensation: 5 sites tested. 5 sites sensed.  Skin Integrity: Negative for blister, skin breakdown or dry skin.  Left Foot:  Protective Sensation: 5 sites sensed.  Skin Integrity: Negative for blister, skin breakdown or dry skin.  Lymphadenopathy:    He has no cervical adenopathy.  Neurological: He is alert and oriented to person, place, and time. No cranial nerve deficit.  Skin: Skin is warm and dry. Capillary refill takes less than 2 seconds. He is not diaphoretic.  Psychiatric: He has a normal mood and affect. His behavior is normal. Judgment and thought content normal.  Nursing note and vitals reviewed.    LABS: Recent Results (from the past 2160 hour(s))  POCT  HgB A1C     Status: Abnormal   Collection Time: 11/07/17 12:06 PM  Result Value Ref Range   Hemoglobin A1C 7.9 (A) 4.0 - 5.6 %   HbA1c, POC (prediabetic range)  5.7 - 6.4 %   HbA1c, POC (controlled diabetic range)  0.0 - 7.0 %  CBC     Status: None   Collection Time: 11/12/17  7:00 AM  Result Value Ref Range   WBC 6.3 3.8 - 10.6 K/uL   RBC 5.65 4.40 - 5.90 MIL/uL   Hemoglobin 15.3 13.0 - 18.0 g/dL   HCT 46.0 40.0 - 52.0 %   MCV 81.5 80.0 - 100.0 fL   MCH 27.1 26.0 - 34.0 pg   MCHC 33.2 32.0 - 36.0 g/dL   RDW 13.2 11.5 - 14.5 %   Platelets 198 150 - 440 K/uL    Comment: Performed at [facility], [address] [place], [place] [address]  Comprehensive metabolic panel     Status: Abnormal   Collection Time: 11/12/17  7:00 AM  Result Value Ref Range   Sodium 136 135 - 145 mmol/L   Potassium 4.4 3.5 - 5.1 mmol/L   Chloride 104 101 - 111 mmol/L   CO2 24 22 - 32 mmol/L   Glucose, Bld 130 (H) 65 - 99 mg/dL  BUN 14 6 - 20 mg/dL   Creatinine, Ser 0.55 (L) 0.61 - 1.24 mg/dL   Calcium 9.5 8.9 - 10.3 mg/dL   Total Protein 7.3 6.5 - 8.1 g/dL   Albumin 4.2 3.5 - 5.0 g/dL   AST 28 15 - 41 U/L   ALT 37 17 - 63 U/L   Alkaline Phosphatase 73 38 - 126 U/L   Total Bilirubin 0.6 0.3 - 1.2 mg/dL   GFR calc non Af Amer >60 >60 mL/min   GFR calc Af Amer >60 >60 mL/min    Comment: (NOTE) The eGFR has been calculated using the CKD EPI equation. This calculation has not been validated in all clinical situations. eGFR's persistently <60 mL/min signify possible Chronic Kidney Disease.    Anion gap 8 5 - 15    Comment: Performed at [facility], [address] [place], [place] [address]  Lipid panel     Status: Abnormal   Collection Time: 11/12/17  7:00 AM  Result Value Ref Range   Cholesterol 108 0 - 200 mg/dL   Triglycerides 103 <150 mg/dL   HDL 35 (L) >40 mg/dL   Total CHOL/HDL Ratio 3.1 RATIO   VLDL 21 0 - 40 mg/dL   LDL Cholesterol 52 0 - 99 mg/dL    Comment:         Total Cholesterol/HDL:CHD Risk Coronary Heart Disease Risk Table                     Men   Women  1/2 Average Risk   3.4   3.3  Average Risk       5.0   4.4  2 X Average Risk   9.6   7.1  3 X Average Risk  23.4   11.0        Use the calculated Patient Ratio above and the CHD Risk Table to determine the patient's CHD Risk.        ATP III CLASSIFICATION (LDL):  <100     mg/dL   Optimal  100-129  mg/dL   Near or Above                    Optimal  130-159  mg/dL   Borderline  160-189  mg/dL   High  >190     mg/dL   Very High Performed at [facility], [place], [place] [address]   PSA     Status: None   Collection Time: 11/12/17  7:00 AM  Result Value Ref Range   Prostatic Specific Antigen 0.75 0.00 - 4.00 ng/mL    Comment: (NOTE) While PSA levels of <=4.0 ng/ml are reported as reference range, some men with levels below 4.0 ng/ml can have prostate cancer and many men with PSA above 4.0 ng/ml do not have prostate cancer.  Other tests such as free PSA, age specific reference ranges, PSA velocity and PSA doubling time may be helpful especially in men less than 69 years old. Performed at [facility], [place] [address] [place], [place] [address]   TSH     Status: None   Collection Time: 11/12/17  7:00 AM  Result Value Ref Range   TSH 1.974 0.350 - 4.500 uIU/mL    Comment: Performed by a 3rd Generation assay with a functional sensitivity of <=0.01 uIU/mL. Performed at [facility], [address] [place], [place] [address]   Microalbumin, urine     Status:  Abnormal   Collection Time: 11/12/17  9:21 AM  Result Value Ref Range   Microalb, Ur <3.0 (H) Not Estab. ug/mL    Comment: (NOTE) **Verified by repeat analysis** Performed At: [facility] [place], [place] [id number] Rush Farmer MD LE:[id number] Performed at [facility], [address] [place], [place] [address]    Assessment/Plan: 1.  Encounter for general adult medical examination with abnormal findings Annual health maintenance exam today.  2. Uncontrolled type 2 diabetes mellitus with hypoglycemia, unspecified hypoglycemia coma status (HCC) - POCT HgB A1C 7.9 today. Increase glyburide 2.5/500mg to twice daily. Increase victoza to 1.43m daily. ADA diet information reviewed with patient.  - glyBURIDE-metformin (GLUCOVANCE) 2.5-500 MG tablet; Take 1 tablet by mouth 2 (two) times daily. With largest meal  Dispense: 180 tablet; Refill: 1 - liraglutide (VICTOZA) 18 MG/3ML SOPN; Inject 0.2 mLs (1.2 mg total) into the skin daily.  Dispense: 3 pen; Refill: 5  3. Gastroesophageal reflux disease without esophagitis - omeprazole (PRILOSEC) 40 MG capsule; Take 1 capsule (40 mg total) by mouth as needed. Reported on 11/27/2015  Dispense: 30 capsule; Refill: 5  General Counseling: Austin Hardin verbalizes understanding of the findings of todays visit and agrees with plan of treatment. I have discussed any further diagnostic evaluation that may be needed or ordered today. We also reviewed his medications today. he has been encouraged to call the office with any questions or concerns that should arise related to todays visit.    Counseling:  Diabetes Counseling:  1. Addition of ACE inh/ ARB'S for nephroprotection. 2. Diabetic foot care, prevention of complications.  3.Exercise and lose weight.  4. Diabetic eye examination, 5. Monitor blood sugar closlely. nutrition counseling.  6.Sign and symptoms of hypoglycemia including shaking sweating,confusion and headaches.   This patient was seen by HLeretha Pol FNP- C in Collaboration with Dr FLavera Guiseas a part of collaborative care agreement  Orders Placed This Encounter  Procedures  . POCT HgB A1C    Meds ordered this encounter  Medications  . glyBURIDE-metformin (GLUCOVANCE) 2.5-500 MG tablet    Sig: Take 1 tablet by mouth 2 (two) times daily. With largest meal    Dispense:   180 tablet    Refill:  1    Order Specific Question:   Supervising Provider    Answer:   KLavera Guise[[[id number]] . liraglutide (VICTOZA) 18 MG/3ML SOPN    Sig: Inject 0.2 mLs (1.2 mg total) into the skin daily.    Dispense:  3 pen    Refill:  5    Order Specific Question:   Supervising Provider    Answer:   KLavera Guise[[[id number]] . omeprazole (PRILOSEC) 40 MG capsule    Sig: Take 1 capsule (40 mg total) by mouth as needed. Reported on 11/27/2015    Dispense:  30 capsule    Refill:  5    Order Specific Question:   Supervising Provider    Answer:   KLavera Guise[[[id number]]   Time spent: 2[place] MD  Internal Medicine

## 2017-11-12 ENCOUNTER — Other Ambulatory Visit
Admission: RE | Admit: 2017-11-12 | Discharge: 2017-11-12 | Disposition: A | Discharge status: Home / Self Care | Payer: 59 | Source: Ambulatory Visit | Attending: Internal Medicine | Admitting: Internal Medicine

## 2017-11-12 DIAGNOSIS — Z125 Encounter for screening for malignant neoplasm of prostate: Secondary | ICD-10-CM | POA: Diagnosis not present

## 2017-11-12 DIAGNOSIS — E119 Type 2 diabetes mellitus without complications: Secondary | ICD-10-CM | POA: Diagnosis not present

## 2017-11-12 DIAGNOSIS — Z0000 Encounter for general adult medical examination without abnormal findings: Secondary | ICD-10-CM | POA: Diagnosis not present

## 2017-11-12 LAB — COMPREHENSIVE METABOLIC PANEL
ALBUMIN: 4.2 g/dL (ref 3.5–5.0)
ALK PHOS: 73 U/L (ref 38–126)
ALT: 37 U/L (ref 17–63)
ANION GAP: 8 (ref 5–15)
AST: 28 U/L (ref 15–41)
BILIRUBIN TOTAL: 0.6 mg/dL (ref 0.3–1.2)
BUN: 14 mg/dL (ref 6–20)
CALCIUM: 9.5 mg/dL (ref 8.9–10.3)
CO2: 24 mmol/L (ref 22–32)
CREATININE: 0.55 mg/dL — AB (ref 0.61–1.24)
Chloride: 104 mmol/L (ref 101–111)
GFR calc non Af Amer: >60 mL/min (ref >60)
GLUCOSE: 130 mg/dL — AB (ref 65–99)
Potassium: 4.4 mmol/L (ref 3.5–5.1)
Sodium: 136 mmol/L (ref 135–145)
Total Protein: 7.3 g/dL (ref 6.5–8.1)

## 2017-11-12 LAB — LIPID PANEL
Cholesterol: 108 mg/dL (ref 0–200)
HDL: 35 mg/dL — AB (ref >40)
LDL CALC: 52 mg/dL (ref 0–99)
Total CHOL/HDL Ratio: 3.1 RATIO
Triglycerides: 103 mg/dL (ref <150)
VLDL: 21 mg/dL (ref 0–40)

## 2017-11-12 LAB — CBC
HCT: 46 % (ref 40.0–52.0)
HEMOGLOBIN: 15.3 g/dL (ref 13.0–18.0)
MCH: 27.1 pg (ref 26.0–34.0)
MCHC: 33.2 g/dL (ref 32.0–36.0)
MCV: 81.5 fL (ref 80.0–100.0)
Platelets: 198 10*3/uL (ref 150–440)
RBC: 5.65 MIL/uL (ref 4.40–5.90)
RDW: 13.2 % (ref 11.5–14.5)
WBC: 6.3 10*3/uL (ref 3.8–10.6)

## 2017-11-12 LAB — PSA: PROSTATIC SPECIFIC ANTIGEN: 0.75 ng/mL (ref 0.00–4.00)

## 2017-11-12 LAB — TSH: TSH: 1.974 u[IU]/mL (ref 0.350–4.500)

## 2017-11-13 LAB — MICROALBUMIN, URINE: Microalb, Ur: <3 ug/mL — ABNORMAL HIGH

## 2017-11-26 DIAGNOSIS — K219 Gastro-esophageal reflux disease without esophagitis: Secondary | ICD-10-CM | POA: Insufficient documentation

## 2017-11-26 DIAGNOSIS — Z0001 Encounter for general adult medical examination with abnormal findings: Secondary | ICD-10-CM | POA: Insufficient documentation

## 2018-03-10 ENCOUNTER — Encounter: Payer: Self-pay | Admitting: Adult Health

## 2018-03-10 ENCOUNTER — Ambulatory Visit: Payer: 59 | Admitting: Adult Health

## 2018-03-10 VITALS — BP 146/82 | HR 78 | Resp 16 | Ht 65.0 in | Wt 201.0 lb

## 2018-03-10 DIAGNOSIS — K219 Gastro-esophageal reflux disease without esophagitis: Secondary | ICD-10-CM | POA: Diagnosis not present

## 2018-03-10 DIAGNOSIS — E1165 Type 2 diabetes mellitus with hyperglycemia: Secondary | ICD-10-CM | POA: Diagnosis not present

## 2018-03-10 LAB — POCT GLYCOSYLATED HEMOGLOBIN (HGB A1C): Hemoglobin A1C: 8.6 % — AB (ref 4.0–5.6)

## 2018-03-10 MED ORDER — LIRAGLUTIDE 18 MG/3ML ~~LOC~~ SOPN: 1.2000 mg | PEN_INJECTOR | Freq: Every day | SUBCUTANEOUS | 3 refills | Status: DC

## 2018-03-10 MED ORDER — CANAGLIFLOZIN-METFORMIN HCL 150-500 MG PO TABS
1.0000 | ORAL_TABLET | Freq: Two times a day (BID) | ORAL | 2 refills | Status: DC
Start: 2018-03-10 — End: 2018-06-23

## 2018-03-10 MED ORDER — OMEPRAZOLE 40 MG PO CPDR: 40.0000 mg | DELAYED_RELEASE_CAPSULE | ORAL | 5 refills | Status: DC | PRN

## 2018-03-10 NOTE — Progress Notes (Signed)
[facility] [address] [place], [place] [address]  Internal MEDICINE  Office Visit Note  Patient Name: Austin Hardin  [id number]  [id number]  Date of Service: 03/16/2018  Chief Complaint  Patient presents with  . Diabetes    4 month follow up    HPI Pt is here for follow up on DM.  His HgA1c is 8.6.  He feels like his medicine is not longer working.  He is currently taking victoza and glyburide/metformin.  He is complaining of heart burn at times.  He reports not taking Prilosec everyday, I encouraged him to take it everyday for a few weeks.      Current Medication: Outpatient Encounter Medications as of 03/10/2018  Medication Sig  . aspirin 81 MG tablet Take 81 mg by mouth daily.  . ergocalciferol (DRISDOL) 50000 units capsule Take 1 capsule (50,000 Units total) by mouth once a week.  . glyBURIDE-metformin (GLUCOVANCE) 2.5-500 MG tablet Take 1 tablet by mouth 2 (two) times daily. With largest meal  . IRON PO Take 65 mg by mouth.  . liraglutide (VICTOZA) 18 MG/3ML SOPN Inject 0.2 mLs (1.2 mg total) into the skin daily.  Marland Kitchen omeprazole (PRILOSEC) 40 MG capsule Take 1 capsule (40 mg total) by mouth as needed. Reported on 11/27/2015  . [DISCONTINUED] liraglutide (VICTOZA) 18 MG/3ML SOPN Inject 0.2 mLs (1.2 mg total) into the skin daily.  . [DISCONTINUED] omeprazole (PRILOSEC) 40 MG capsule Take 1 capsule (40 mg total) by mouth as needed. Reported on 11/27/2015  . Canagliflozin-metFORMIN HCl (INVOKAMET) 150-500 MG TABS Take 1 tablet by mouth 2 (two) times daily.   No facility-administered encounter medications on file as of 03/10/2018.     Surgical History: Past Surgical History:  Procedure Laterality Date  . HERNIA REPAIR  1991    Medical History: Past Medical History:  Diagnosis Date  . Allergy    environmental  . Diabetes mellitus without complication (HCC)     Family History: Family History  Problem Relation Age of Onset  . Diabetes Mother   .  Hyperlipidemia Mother   . Hypertension Mother   . Cancer Father   . Diabetes Father   . Hyperlipidemia Father   . Hypertension Father   . Heart disease Father   . Diabetes Brother   . Diabetes Brother     Social History   Socioeconomic History  . Marital status: Married    Spouse name: Not on file  . Number of children: Not on file  . Years of education: Not on file  . Highest education level: Not on file  Occupational History  . Not on file  Social Needs  . Financial resource strain: Not on file  . Food insecurity:    Worry: Not on file    Inability: Not on file  . Transportation needs:    Medical: Not on file    Non-medical: Not on file  Tobacco Use  . Smoking status: Never Smoker  . Smokeless tobacco: Never Used  Substance and Sexual Activity  . Alcohol use: No    Alcohol/week: 0.0 standard drinks  . Drug use: No  . Sexual activity: Not on file  Lifestyle  . Physical activity:    Days per week: Not on file    Minutes per session: Not on file  . Stress: Not on file  Relationships  . Social connections:    Talks on phone: Not on file    Gets together: Not on file    Attends  religious service: Not on file    Active member of club or organization: Not on file    Attends meetings of clubs or organizations: Not on file    Relationship status: Not on file  . Intimate partner violence:    Fear of current or ex partner: Not on file    Emotionally abused: Not on file    Physically abused: Not on file    Forced sexual activity: Not on file  Other Topics Concern  . Not on file  Social History Narrative  . Not on file      Review of Systems  Constitutional: Negative.  Negative for chills, fatigue and unexpected weight change.  HENT: Negative.  Negative for congestion, rhinorrhea, sneezing and sore throat.   Eyes: Negative for redness.  Respiratory: Negative.  Negative for cough, chest tightness and shortness of breath.   Cardiovascular: Negative.  Negative  for chest pain and palpitations.  Gastrointestinal: Negative.  Negative for abdominal pain, constipation, diarrhea, nausea and vomiting.  Endocrine: Negative.   Genitourinary: Negative.  Negative for dysuria and frequency.  Musculoskeletal: Negative.  Negative for arthralgias, back pain, joint swelling and neck pain.  Skin: Negative.  Negative for rash.  Allergic/Immunologic: Negative.   Neurological: Negative.  Negative for tremors and numbness.  Hematological: Negative for adenopathy. Does not bruise/bleed easily.  Psychiatric/Behavioral: Negative.  Negative for behavioral problems, sleep disturbance and suicidal ideas. The patient is not nervous/anxious.     Vital Signs: BP (!) 146/82 (BP Location: Right Arm, Patient Position: Sitting, Cuff Size: Normal)   Pulse 78   Resp 16   Ht 5\' 5"  (1.651 m)   Wt 201 lb (91.2 kg)   SpO2 96%   BMI 33.45 kg/m    Physical Exam  Constitutional: He is oriented to person, place, and time. He appears well-developed and well-nourished. No distress.  HENT:  Head: Normocephalic and atraumatic.  Mouth/Throat: Oropharynx is clear and moist. No oropharyngeal exudate.  Eyes: Pupils are equal, round, and reactive to light. EOM are normal.  Neck: Normal range of motion. Neck supple. No JVD present. No tracheal deviation present. No thyromegaly present.  Cardiovascular: Normal rate, regular rhythm and normal heart sounds. Exam reveals no gallop and no friction rub.  No murmur heard. Pulmonary/Chest: Effort normal and breath sounds normal. No respiratory distress. He has no wheezes. He has no rales. He exhibits no tenderness.  Abdominal: Soft. There is no tenderness. There is no guarding.  Musculoskeletal: Normal range of motion.  Lymphadenopathy:    He has no cervical adenopathy.  Neurological: He is alert and oriented to person, place, and time. No cranial nerve deficit.  Skin: Skin is warm and dry. He is not diaphoretic.  Psychiatric: He has a normal  mood and affect. His behavior is normal. Judgment and thought content normal.  Nursing note and vitals reviewed.   Assessment/Plan: 1. Uncontrolled type 2 diabetes mellitus with hyperglycemia (HCC) A1C has increased.  Will change medication regimen to invokanamet.  Continue Victoza. - POCT HgB A1C - liraglutide (VICTOZA) 18 MG/3ML SOPN; Inject 0.2 mLs (1.2 mg total) into the skin daily.  Dispense: 9 pen; Refill: 3 - Canagliflozin-metFORMIN HCl (INVOKAMET) 150-500 MG TABS; Take 1 tablet by mouth 2 (two) times daily.  Dispense: 60 tablet; Refill: 2  2. Gastroesophageal reflux disease without esophagitis Take Prilosec as discussed.  - omeprazole (PRILOSEC) 40 MG capsule; Take 1 capsule (40 mg total) by mouth as needed. Reported on 11/27/2015  Dispense: 30 capsule; Refill: 5  General Counseling: Jaylyn verbalizes understanding of the findings of todays visit and agrees with plan of treatment. I have discussed any further diagnostic evaluation that may be needed or ordered today. We also reviewed his medications today. he has been encouraged to call the office with any questions or concerns that should arise related to todays visit.    Orders Placed This Encounter  Procedures  . POCT HgB A1C    Meds ordered this encounter  Medications  . liraglutide (VICTOZA) 18 MG/3ML SOPN    Sig: Inject 0.2 mLs (1.2 mg total) into the skin daily.    Dispense:  9 pen    Refill:  3  . Canagliflozin-metFORMIN HCl (INVOKAMET) 150-500 MG TABS    Sig: Take 1 tablet by mouth 2 (two) times daily.    Dispense:  60 tablet    Refill:  2  . omeprazole (PRILOSEC) 40 MG capsule    Sig: Take 1 capsule (40 mg total) by mouth as needed. Reported on 11/27/2015    Dispense:  30 capsule    Refill:  5    Time spent: 25 Minutes   This patient was seen by Blima Ledger AGNP-C in Collaboration with Dr Lyndon Code as a part of collaborative care agreement    Blima Ledger [facility] Internal medicine

## 2018-03-10 NOTE — Patient Instructions (Signed)
Diabetes Mellitus and Nutrition When you have diabetes (diabetes mellitus), it is very important to have healthy eating habits because your blood sugar (glucose) levels are greatly affected by what you eat and drink. Eating healthy foods in the appropriate amounts, at about the same times every day, can help you:  Control your blood glucose.  Lower your risk of heart disease.  Improve your blood pressure.  Reach or maintain a healthy weight.  Every person with diabetes is different, and each person has different needs for a meal plan. Your health care provider may recommend that you work with a diet and nutrition specialist (dietitian) to make a meal plan that is best for you. Your meal plan may vary depending on factors such as:  The calories you need.  The medicines you take.  Your weight.  Your blood glucose, blood pressure, and cholesterol levels.  Your activity level.  Other health conditions you have, such as heart or kidney disease.  How do carbohydrates affect me? Carbohydrates affect your blood glucose level more than any other type of food. Eating carbohydrates naturally increases the amount of glucose in your blood. Carbohydrate counting is a method for keeping track of how many carbohydrates you eat. Counting carbohydrates is important to keep your blood glucose at a healthy level, especially if you use insulin or take certain oral diabetes medicines. It is important to know how many carbohydrates you can safely have in each meal. This is different for every person. Your dietitian can help you calculate how many carbohydrates you should have at each meal and for snack. Foods that contain carbohydrates include:  Bread, cereal, rice, pasta, and crackers.  Potatoes and corn.  Peas, beans, and lentils.  Milk and yogurt.  Fruit and juice.  Desserts, such as cakes, cookies, ice cream, and candy.  How does alcohol affect me? Alcohol can cause a sudden decrease in blood  glucose (hypoglycemia), especially if you use insulin or take certain oral diabetes medicines. Hypoglycemia can be a life-threatening condition. Symptoms of hypoglycemia (sleepiness, dizziness, and confusion) are similar to symptoms of having too much alcohol. If your health care provider says that alcohol is safe for you, follow these guidelines:  Limit alcohol intake to no more than 1 drink per day for nonpregnant women and 2 drinks per day for men. One drink equals 12 oz of beer, 5 oz of wine, or 1 oz of hard liquor.  Do not drink on an empty stomach.  Keep yourself hydrated with water, diet soda, or unsweetened iced tea.  Keep in mind that regular soda, juice, and other mixers may contain a lot of sugar and must be counted as carbohydrates.  What are tips for following this plan? Reading food labels  Start by checking the serving size on the label. The amount of calories, carbohydrates, fats, and other nutrients listed on the label are based on one serving of the food. Many foods contain more than one serving per package.  Check the total grams (g) of carbohydrates in one serving. You can calculate the number of servings of carbohydrates in one serving by dividing the total carbohydrates by 15. For example, if a food has 30 g of total carbohydrates, it would be equal to 2 servings of carbohydrates.  Check the number of grams (g) of saturated and trans fats in one serving. Choose foods that have low or no amount of these fats.  Check the number of milligrams (mg) of sodium in one serving. Most people   should limit total sodium intake to less than 2,300 mg per day.  Always check the nutrition information of foods labeled as "low-fat" or "nonfat". These foods may be higher in added sugar or refined carbohydrates and should be avoided.  Talk to your dietitian to identify your daily goals for nutrients listed on the label. Shopping  Avoid buying canned, premade, or processed foods. These  foods tend to be high in fat, sodium, and added sugar.  Shop around the outside edge of the grocery store. This includes fresh fruits and vegetables, bulk grains, fresh meats, and fresh dairy. Cooking  Use low-heat cooking methods, such as baking, instead of high-heat cooking methods like deep frying.  Cook using healthy oils, such as olive, canola, or sunflower oil.  Avoid cooking with butter, cream, or high-fat meats. Meal planning  Eat meals and snacks regularly, preferably at the same times every day. Avoid going long periods of time without eating.  Eat foods high in fiber, such as fresh fruits, vegetables, beans, and whole grains. Talk to your dietitian about how many servings of carbohydrates you can eat at each meal.  Eat 4-6 ounces of lean protein each day, such as lean meat, chicken, fish, eggs, or tofu. 1 ounce is equal to 1 ounce of meat, chicken, or fish, 1 egg, or 1/4 cup of tofu.  Eat some foods each day that contain healthy fats, such as avocado, nuts, seeds, and fish. Lifestyle   Check your blood glucose regularly.  Exercise at least 30 minutes 5 or more days each week, or as told by your health care provider.  Take medicines as told by your health care provider.  Do not use any products that contain nicotine or tobacco, such as cigarettes and e-cigarettes. If you need help quitting, ask your health care provider.  Work with a counselor or diabetes educator to identify strategies to manage stress and any emotional and social challenges. What are some questions to ask my health care provider?  Do I need to meet with a diabetes educator?  Do I need to meet with a dietitian?  What number can I call if I have questions?  When are the best times to check my blood glucose? Where to find more information:  American Diabetes Association: diabetes.org/food-and-fitness/food  Academy of Nutrition and Dietetics:  www.eatright.org/resources/health/diseases-and-conditions/diabetes  National Institute of Diabetes and Digestive and Kidney Diseases (NIH): www.niddk.nih.gov/health-information/diabetes/overview/diet-eating-physical-activity Summary  A healthy meal plan will help you control your blood glucose and maintain a healthy lifestyle.  Working with a diet and nutrition specialist (dietitian) can help you make a meal plan that is best for you.  Keep in mind that carbohydrates and alcohol have immediate effects on your blood glucose levels. It is important to count carbohydrates and to use alcohol carefully. This information is not intended to replace advice given to you by your health care provider. Make sure you discuss any questions you have with your health care provider. Document Released: 02/21/2005 Document Revised: 07/01/2016 Document Reviewed: 07/01/2016 Elsevier Interactive Patient Education  2018 Elsevier Inc.  

## 2018-03-13 ENCOUNTER — Telehealth: Payer: Self-pay | Admitting: Adult Health

## 2018-03-13 NOTE — Telephone Encounter (Signed)
Prior auth for invokamet has been approved by insurance, pharmacy notified and coupon was given to patient for additional savings

## 2018-03-23 DIAGNOSIS — H524 Presbyopia: Secondary | ICD-10-CM | POA: Diagnosis not present

## 2018-03-23 DIAGNOSIS — E119 Type 2 diabetes mellitus without complications: Secondary | ICD-10-CM | POA: Diagnosis not present

## 2018-03-23 DIAGNOSIS — E089 Diabetes mellitus due to underlying condition without complications: Secondary | ICD-10-CM | POA: Diagnosis not present

## 2018-05-05 ENCOUNTER — Other Ambulatory Visit: Payer: Self-pay

## 2018-05-05 ENCOUNTER — Ambulatory Visit: Payer: Self-pay | Admitting: Adult Health

## 2018-05-05 DIAGNOSIS — E1165 Type 2 diabetes mellitus with hyperglycemia: Secondary | ICD-10-CM

## 2018-05-05 MED ORDER — LIRAGLUTIDE 18 MG/3ML ~~LOC~~ SOPN: 1.8000 mg | PEN_INJECTOR | Freq: Every day | SUBCUTANEOUS | 1 refills | Status: DC

## 2018-05-05 MED ORDER — LIRAGLUTIDE 18 MG/3ML ~~LOC~~ SOPN: PEN_INJECTOR | SUBCUTANEOUS | 1 refills | Status: DC

## 2018-05-05 NOTE — Telephone Encounter (Signed)
Spoke he was on victoza 1.8mg  we 1.2 mg in pres I fix and send phar for 90 days

## 2018-05-19 ENCOUNTER — Other Ambulatory Visit: Payer: Self-pay | Admitting: Adult Health

## 2018-05-19 DIAGNOSIS — E11649 Type 2 diabetes mellitus with hypoglycemia without coma: Secondary | ICD-10-CM

## 2018-05-19 MED ORDER — GLYBURIDE-METFORMIN 2.5-500 MG PO TABS: 1.0000 | ORAL_TABLET | Freq: Two times a day (BID) | ORAL | 1 refills | Status: DC

## 2018-06-23 ENCOUNTER — Telehealth: Payer: Self-pay | Admitting: Internal Medicine

## 2018-06-23 ENCOUNTER — Encounter: Payer: Self-pay | Admitting: Internal Medicine

## 2018-06-23 ENCOUNTER — Ambulatory Visit: Payer: 59 | Admitting: Internal Medicine

## 2018-06-23 DIAGNOSIS — K219 Gastro-esophageal reflux disease without esophagitis: Secondary | ICD-10-CM

## 2018-06-23 DIAGNOSIS — Z1211 Encounter for screening for malignant neoplasm of colon: Secondary | ICD-10-CM | POA: Diagnosis not present

## 2018-06-23 DIAGNOSIS — E1165 Type 2 diabetes mellitus with hyperglycemia: Secondary | ICD-10-CM

## 2018-06-23 LAB — POCT GLYCOSYLATED HEMOGLOBIN (HGB A1C): Hemoglobin A1C: 7.9 % — AB (ref 4.0–5.6)

## 2018-06-23 MED ORDER — METFORMIN HCL 500 MG PO TABS: 500.0000 mg | ORAL_TABLET | Freq: Two times a day (BID) | ORAL | 3 refills | Status: DC

## 2018-06-23 NOTE — Progress Notes (Signed)
[facility] [address] [place], [place] [address]  Internal MEDICINE  Office Visit Note  Patient Name: Austin Hardin  [id number]  [id number]  Date of Service: 07/14/2018  Chief Complaint  Patient presents with  . Diabetes    a1c 7.9 today, 3 months ago 8.6  . Gastroesophageal Reflux  . Quality Metric Gaps    colonoscopy    Pt is here for diabetes follow up, Sugar is under better control, He is tolerating Victoza, however it can be better. Invokana was too expensive for him so will like to try something else, will need to have colonoscopy.  Current Medication: Outpatient Encounter Medications as of 06/23/2018  Medication Sig  . aspirin 81 MG tablet Take 81 mg by mouth daily.  . ergocalciferol (DRISDOL) 50000 units capsule Take 1 capsule (50,000 Units total) by mouth once a week.  . IRON PO Take 65 mg by mouth.  . liraglutide (VICTOZA) 18 MG/3ML SOPN Inject 0.3 mLs (1.8 mg total) into the skin daily. Inject 1.8 mg daily  . omeprazole (PRILOSEC) 40 MG capsule Take 1 capsule (40 mg total) by mouth as needed. Reported on 11/27/2015  . [DISCONTINUED] glyBURIDE-metformin (GLUCOVANCE) 2.5-500 MG tablet Take 1 tablet by mouth 2 (two) times daily. With largest meal  . metFORMIN (GLUCOPHAGE) 500 MG tablet Take 1 tablet (500 mg total) by mouth 2 (two) times daily with a meal.  . [DISCONTINUED] Canagliflozin-metFORMIN HCl (INVOKAMET) 150-500 MG TABS Take 1 tablet by mouth 2 (two) times daily. (Patient not taking: Reported on 06/23/2018)   No facility-administered encounter medications on file as of 06/23/2018.     Surgical History: Past Surgical History:  Procedure Laterality Date  . HERNIA REPAIR  1991    Medical History: Past Medical History:  Diagnosis Date  . Allergy    environmental  . Diabetes mellitus without complication (HCC)     Family History: Family History  Problem Relation Age of Onset  . Diabetes Mother   . Hyperlipidemia Mother   . Hypertension  Mother   . Cancer Father   . Diabetes Father   . Hyperlipidemia Father   . Hypertension Father   . Heart disease Father   . Diabetes Brother   . Diabetes Brother     Social History   Socioeconomic History  . Marital status: Married    Spouse name: Not on file  . Number of children: Not on file  . Years of education: Not on file  . Highest education level: Not on file  Occupational History  . Not on file  Social Needs  . Financial resource strain: Not on file  . Food insecurity:    Worry: Not on file    Inability: Not on file  . Transportation needs:    Medical: Not on file    Non-medical: Not on file  Tobacco Use  . Smoking status: Never Smoker  . Smokeless tobacco: Never Used  Substance and Sexual Activity  . Alcohol use: No    Alcohol/week: 0.0 standard drinks  . Drug use: No  . Sexual activity: Not on file  Lifestyle  . Physical activity:    Days per week: Not on file    Minutes per session: Not on file  . Stress: Not on file  Relationships  . Social connections:    Talks on phone: Not on file    Gets together: Not on file    Attends religious service: Not on file    Active member of club or  organization: Not on file    Attends meetings of clubs or organizations: Not on file    Relationship status: Not on file  . Intimate partner violence:    Fear of current or ex partner: Not on file    Emotionally abused: Not on file    Physically abused: Not on file    Forced sexual activity: Not on file  Other Topics Concern  . Not on file  Social History Narrative  . Not on file    Review of Systems  Constitutional: Negative for chills, fatigue and unexpected weight change.  HENT: Positive for postnasal drip. Negative for congestion, rhinorrhea, sneezing and sore throat.   Eyes: Negative for redness.  Respiratory: Negative for cough, chest tightness and shortness of breath.   Cardiovascular: Negative for chest pain and palpitations.  Gastrointestinal: Negative  for abdominal pain, constipation, diarrhea, nausea and vomiting.  Genitourinary: Negative for dysuria and frequency.  Musculoskeletal: Negative for arthralgias, back pain, joint swelling and neck pain.  Skin: Negative for rash.  Neurological: Negative.  Negative for tremors and numbness.  Hematological: Negative for adenopathy. Does not bruise/bleed easily.  Psychiatric/Behavioral: Negative for behavioral problems (Depression), sleep disturbance and suicidal ideas. The patient is not nervous/anxious.     Vital Signs: BP 138/84 (BP Location: Left Arm, Patient Position: Sitting, Cuff Size: Normal)   Pulse 67   Resp 16   Ht 5\' 5"  (1.651 m)   Wt 201 lb 9.6 oz (91.4 kg)   SpO2 98%   BMI 33.55 kg/m    Physical Exam Constitutional:      General: He is not in acute distress.    Appearance: He is well-developed. He is not diaphoretic.  HENT:     Head: Normocephalic and atraumatic.     Mouth/Throat:     Pharynx: No oropharyngeal exudate.  Eyes:     Pupils: Pupils are equal, round, and reactive to light.  Neck:     Musculoskeletal: Normal range of motion and neck supple.     Thyroid: No thyromegaly.     Vascular: No JVD.     Trachea: No tracheal deviation.  Cardiovascular:     Rate and Rhythm: Normal rate and regular rhythm.     Heart sounds: Normal heart sounds. No murmur. No friction rub. No gallop.   Pulmonary:     Effort: Pulmonary effort is normal. No respiratory distress.     Breath sounds: No wheezing or rales.  Chest:     Chest wall: No tenderness.  Abdominal:     General: Bowel sounds are normal.     Palpations: Abdomen is soft.  Musculoskeletal: Normal range of motion.  Lymphadenopathy:     Cervical: No cervical adenopathy.  Skin:    General: Skin is warm and dry.  Neurological:     Mental Status: He is alert and oriented to person, place, and time.     Cranial Nerves: No cranial nerve deficit.  Psychiatric:        Behavior: Behavior normal.        Thought  Content: Thought content normal.        Judgment: Judgment normal.    Assessment/Plan: 1. Uncontrolled type 2 diabetes mellitus with hyperglycemia (HCC) - POCT HgB A1C, samples of Jardiance is given, will check if any of these meds are covered. Change metfromin 500 mg po bid, might need Farxiga  2. Gastroesophageal reflux disease without esophagitis - Controlled   3. Encounter for screening colonoscopy - GI for colonoscopy  General Counseling: Oleg verbalizes understanding of the findings of todays visit and agrees with plan of treatment. I have discussed any further diagnostic evaluation that may be needed or ordered today. We also reviewed his medications today. he has been encouraged to call the office with any questions or concerns that should arise related to todays visit.  Diabetes Counseling:  1. Addition of ACE inh/ ARB'S for nephroprotection. Microalbumin is updated  2. Diabetic foot care, prevention of complications. Podiatry consult 3. Exercise and lose weight.  4. Diabetic eye examination, Diabetic eye exam is updated  5. Monitor blood sugar closlely. nutrition counseling.  6. Sign and symptoms of hypoglycemia including shaking sweating,confusion and headaches.   Orders Placed This Encounter  Procedures  . POCT HgB A1C    Meds ordered this encounter  Medications  . metFORMIN (GLUCOPHAGE) 500 MG tablet    Sig: Take 1 tablet (500 mg total) by mouth 2 (two) times daily with a meal.    Dispense:  180 tablet    Refill:  3    Time spent:25 Minutes  Dr Lyndon CodeFozia M Khan Internal medicine

## 2018-06-23 NOTE — Telephone Encounter (Signed)
Called [facility] regarding medication cost, per [facility] he is enrolled in diabetic program that it should lower cost of his medication , Dr Welton Flakes gave him samples of Jardiance today and we will see which one he does well with.  Jardiance 10mg  was given 2 box of 7  Jardiance 25 mg was given 2 box of 7 , patient will let us know how he does

## 2018-07-06 ENCOUNTER — Other Ambulatory Visit: Payer: Self-pay | Admitting: Internal Medicine

## 2018-07-14 ENCOUNTER — Telehealth: Payer: Self-pay

## 2018-07-14 MED ORDER — DAPAGLIFLOZIN PROPANEDIOL 10 MG PO TABS: 10.0000 mg | ORAL_TABLET | Freq: Every day | ORAL | 3 refills | Status: DC

## 2018-07-14 NOTE — Telephone Encounter (Signed)
Pt advised that we send farxiga to phar check with [facility] how much you have to pay and let us know if not covered

## 2018-09-23 ENCOUNTER — Other Ambulatory Visit: Payer: Self-pay

## 2018-09-23 ENCOUNTER — Ambulatory Visit: Payer: 59 | Admitting: Nurse Practitioner

## 2018-09-23 VITALS — BP 133/77 | HR 74 | Resp 16 | Ht 65.0 in | Wt 180.0 lb

## 2018-09-23 DIAGNOSIS — K219 Gastro-esophageal reflux disease without esophagitis: Secondary | ICD-10-CM | POA: Diagnosis not present

## 2018-09-23 DIAGNOSIS — E1165 Type 2 diabetes mellitus with hyperglycemia: Secondary | ICD-10-CM

## 2018-09-23 MED ORDER — LIRAGLUTIDE 18 MG/3ML ~~LOC~~ SOPN: 1.8000 mg | PEN_INJECTOR | Freq: Every day | SUBCUTANEOUS | 3 refills | Status: DC

## 2018-09-23 NOTE — Progress Notes (Signed)
[facility] [address] [place], [place] [id number]  Internal MEDICINE  Telephone Visit  Patient Name: Austin BellingMehulkumar Mccall  [id number]  [id number]  Date of Service: 10/07/2018  I connected with the patient at 10:39am by webcam and verified the patients identity using two identifiers.   I discussed the limitations, risks, security and privacy concerns of performing an evaluation and management service by webcam and the availability of in person appointments. I also discussed with the patient that there may be a patient responsible charge related to the service.  The patient expressed understanding and agrees to proceed.    Chief Complaint  Patient presents with  . Telephone Assessment  . Telephone Screen  . Diabetes    glucose 121  . Quality Metric Gaps    cologuard    The patient has been contacted via webcam for follow up visit due to concerns for spread of novel coronavirus. Blood sugars are doing well. States that non-fasting sugar was 150 this morning. No concerns or complaints today.   Diabetes  He presents for his follow-up diabetic visit. He has type 2 diabetes mellitus. His disease course has been stable. There are no hypoglycemic associated symptoms. Pertinent negatives for hypoglycemia include no dizziness, headaches, nervousness/anxiousness or tremors. There are no diabetic associated symptoms. Pertinent negatives for diabetes include no chest pain and no fatigue. There are no hypoglycemic complications. Symptoms are stable. There are no diabetic complications. Risk factors for coronary artery disease include family history. Current diabetic treatment includes oral agent (monotherapy) (victoza). He is compliant with treatment all of the time. His weight is stable. He is following a generally healthy diet. Meal planning includes avoidance of concentrated sweets. He has not had a previous visit with a dietitian. He participates in exercise daily. There is no change in his  home blood glucose trend. An ACE inhibitor/angiotensin II receptor blocker is not being taken. He does not see a podiatrist.Eye exam is current.       Current Medication: Outpatient Encounter Medications as of 09/23/2018  Medication Sig  . aspirin 81 MG tablet Take 81 mg by mouth daily.  . dapagliflozin propanediol (FARXIGA) 10 MG TABS tablet Take 10 mg by mouth daily.  . ergocalciferol (DRISDOL) 50000 units capsule Take 1 capsule (50,000 Units total) by mouth once a week.  . IRON PO Take 65 mg by mouth.  . metFORMIN (GLUCOPHAGE) 500 MG tablet Take 1 tablet (500 mg total) by mouth 2 (two) times daily with a meal.  . omeprazole (PRILOSEC) 40 MG capsule Take 1 capsule (40 mg total) by mouth as needed. Reported on 11/27/2015  . UNIFINE PENTIPS 32G X 4 MM MISC FOR USE WITH ONCE DAILY VICTOZA  . liraglutide (VICTOZA) 18 MG/3ML SOPN Inject 0.3 mLs (1.8 mg total) into the skin daily.  . [DISCONTINUED] liraglutide (VICTOZA) 18 MG/3ML SOPN Inject 0.3 mLs (1.8 mg total) into the skin daily. Inject 1.8 mg daily   No facility-administered encounter medications on file as of 09/23/2018.     Surgical History: Past Surgical History:  Procedure Laterality Date  . HERNIA REPAIR  1991    Medical History: Past Medical History:  Diagnosis Date  . Allergy    environmental  . Diabetes mellitus without complication (HCC)     Family History: Family History  Problem Relation Age of Onset  . Diabetes Mother   . Hyperlipidemia Mother   . Hypertension Mother   . Cancer Father   . Diabetes Father   . Hyperlipidemia Father   .  Hypertension Father   . Heart disease Father   . Diabetes Brother   . Diabetes Brother     Social History   Socioeconomic History  . Marital status: Married    Spouse name: Not on file  . Number of children: Not on file  . Years of education: Not on file  . Highest education level: Not on file  Occupational History  . Not on file  Social Needs  . Financial resource  strain: Not on file  . Food insecurity:    Worry: Not on file    Inability: Not on file  . Transportation needs:    Medical: Not on file    Non-medical: Not on file  Tobacco Use  . Smoking status: Never Smoker  . Smokeless tobacco: Never Used  Substance and Sexual Activity  . Alcohol use: No    Alcohol/week: 0.0 standard drinks  . Drug use: No  . Sexual activity: Not on file  Lifestyle  . Physical activity:    Days per week: Not on file    Minutes per session: Not on file  . Stress: Not on file  Relationships  . Social connections:    Talks on phone: Not on file    Gets together: Not on file    Attends religious service: Not on file    Active member of club or organization: Not on file    Attends meetings of clubs or organizations: Not on file    Relationship status: Not on file  . Intimate partner violence:    Fear of current or ex partner: Not on file    Emotionally abused: Not on file    Physically abused: Not on file    Forced sexual activity: Not on file  Other Topics Concern  . Not on file  Social History Narrative  . Not on file      Review of Systems  Constitutional: Negative for activity change, chills, fatigue and unexpected weight change.  HENT: Negative for congestion, postnasal drip, rhinorrhea, sneezing and sore throat.   Respiratory: Negative for cough, chest tightness, shortness of breath and wheezing.   Cardiovascular: Negative for chest pain and palpitations.  Gastrointestinal: Negative for abdominal pain, constipation, diarrhea and nausea.  Endocrine:       Blood sugars doing well   Musculoskeletal: Negative for arthralgias, back pain, joint swelling and neck pain.  Skin: Negative for rash.  Allergic/Immunologic: Negative for environmental allergies.  Neurological: Negative for dizziness, tremors, numbness and headaches.  Hematological: Negative for adenopathy. Does not bruise/bleed easily.  Psychiatric/Behavioral: Negative for behavioral  problems (Depression), sleep disturbance and suicidal ideas. The patient is not nervous/anxious.     Today's Vitals   09/23/18 1016  BP: 133/77  Pulse: 74  Resp: 16  Weight: 180 lb (81.6 kg)  Height: 5\' 5"  (1.651 m)   Body mass index is 29.95 kg/m.   Observation/Objective:  The patient is alert and oriented. He is pleasant and answers all questions appropriately. Ne is in no acute distress.    Assessment/Plan: 1. Uncontrolled type 2 diabetes mellitus with hyperglycemia (HCC) Blood sugars well controlled. Continue diabetic medication as prescribed. Refilled victoza today. Check HgbA1c at next visit  - liraglutide (VICTOZA) 18 MG/3ML SOPN; Inject 0.3 mLs (1.8 mg total) into the skin daily.  Dispense: 9 pen; Refill: 3  2. Gastroesophageal reflux disease without esophagitis Continue omeprazole as prescribed   General Counseling: Collyn verbalizes understanding of the findings of today's phone visit and agrees with plan  of treatment. I have discussed any further diagnostic evaluation that may be needed or ordered today. We also reviewed his medications today. he has been encouraged to call the office with any questions or concerns that should arise related to todays visit.  Diabetes Counseling:  1. Addition of ACE inh/ ARB'S for nephroprotection. Microalbumin is updated  2. Diabetic foot care, prevention of complications. Podiatry consult 3. Exercise and lose weight.  4. Diabetic eye examination, Diabetic eye exam is updated  5. Monitor blood sugar closlely. nutrition counseling.  6. Sign and symptoms of hypoglycemia including shaking sweating,confusion and headaches.  This patient was seen by Vincent Gros FNP Collaboration with Dr Lyndon Code as a part of collaborative care agreement  Meds ordered this encounter  Medications  . liraglutide (VICTOZA) 18 MG/3ML SOPN    Sig: Inject 0.3 mLs (1.8 mg total) into the skin daily.    Dispense:  9 pen    Refill:  3    Pt need  90 days supply    Order Specific Question:   Supervising Provider    Answer:   Lyndon Code [[id number]]    Time spent: 58 Minutes    Dr Lyndon Code Internal medicine

## 2018-10-07 ENCOUNTER — Encounter: Payer: Self-pay | Admitting: Nurse Practitioner

## 2018-10-07 DIAGNOSIS — E1165 Type 2 diabetes mellitus with hyperglycemia: Secondary | ICD-10-CM | POA: Insufficient documentation

## 2018-10-09 ENCOUNTER — Other Ambulatory Visit: Payer: Self-pay | Admitting: Internal Medicine

## 2018-12-21 ENCOUNTER — Other Ambulatory Visit: Payer: Self-pay

## 2018-12-21 DIAGNOSIS — E1165 Type 2 diabetes mellitus with hyperglycemia: Secondary | ICD-10-CM

## 2018-12-21 MED ORDER — LIRAGLUTIDE 18 MG/3ML ~~LOC~~ SOPN: 1.8000 mg | PEN_INJECTOR | Freq: Every day | SUBCUTANEOUS | 3 refills | Status: DC

## 2019-01-12 DIAGNOSIS — H52223 Regular astigmatism, bilateral: Secondary | ICD-10-CM | POA: Diagnosis not present

## 2019-01-12 DIAGNOSIS — H524 Presbyopia: Secondary | ICD-10-CM | POA: Diagnosis not present

## 2019-01-12 DIAGNOSIS — H5213 Myopia, bilateral: Secondary | ICD-10-CM | POA: Diagnosis not present

## 2019-01-19 ENCOUNTER — Encounter: Payer: 59 | Admitting: Nurse Practitioner

## 2019-02-23 ENCOUNTER — Encounter: Payer: Self-pay | Admitting: Nurse Practitioner

## 2019-02-23 ENCOUNTER — Ambulatory Visit (INDEPENDENT_AMBULATORY_CARE_PROVIDER_SITE_OTHER): Payer: 59 | Admitting: Nurse Practitioner

## 2019-02-23 ENCOUNTER — Other Ambulatory Visit: Payer: Self-pay

## 2019-02-23 VITALS — BP 143/84 | HR 66 | Resp 16 | Ht 65.0 in | Wt 184.0 lb

## 2019-02-23 DIAGNOSIS — E1165 Type 2 diabetes mellitus with hyperglycemia: Secondary | ICD-10-CM

## 2019-02-23 DIAGNOSIS — Z0001 Encounter for general adult medical examination with abnormal findings: Secondary | ICD-10-CM | POA: Diagnosis not present

## 2019-02-23 DIAGNOSIS — K219 Gastro-esophageal reflux disease without esophagitis: Secondary | ICD-10-CM

## 2019-02-23 MED ORDER — METFORMIN HCL 500 MG PO TABS: 750.0000 mg | ORAL_TABLET | Freq: Two times a day (BID) | ORAL | 1 refills | Status: DC

## 2019-02-23 NOTE — Progress Notes (Signed)
[facility] [place], [place] [address]  Internal MEDICINE  Office Visit Note  Patient Name: Austin Hardin  [id number]  [id number]  Date of Service: 03/04/2019   Pt is here for routine health maintenance examination   Chief Complaint  Patient presents with  . Annual Exam  . Diabetes     The patient is here for health maintenance exam. Blood sugars are steady but continue to run a little high. hgbA1c is 7.9 today. This is unchanged from the last check of HgbA1c. He states that he feels well and has not concerns or complaints. He is due to have routine, fasting labs. He states that he has had colonoscopy in the past, but is unsure when this was done. States that results were normal.    Current Medication: Outpatient Encounter Medications as of 02/23/2019  Medication Sig  . ACCU-CHEK GUIDE test strip USE TO CHECK BS TWICE A DAY  . aspirin 81 MG tablet Take 81 mg by mouth daily.  . dapagliflozin propanediol (FARXIGA) 10 MG TABS tablet Take 10 mg by mouth daily.  . ergocalciferol (DRISDOL) 50000 units capsule Take 1 capsule (50,000 Units total) by mouth once a week.  . IRON PO Take 65 mg by mouth.  . liraglutide (VICTOZA) 18 MG/3ML SOPN Inject 0.3 mLs (1.8 mg total) into the skin daily. 90 days supply ok to fill  . metFORMIN (GLUCOPHAGE) 500 MG tablet Take 1.5 tablets (750 mg total) by mouth 2 (two) times daily with a meal.  . omeprazole (PRILOSEC) 40 MG capsule Take 1 capsule (40 mg total) by mouth as needed. Reported on 11/27/2015  . UNIFINE PENTIPS 32G X 4 MM MISC FOR USE WITH ONCE DAILY VICTOZA  . [DISCONTINUED] metFORMIN (GLUCOPHAGE) 500 MG tablet Take 1 tablet (500 mg total) by mouth 2 (two) times daily with a meal.   No facility-administered encounter medications on file as of 02/23/2019.     Surgical History: Past Surgical History:  Procedure Laterality Date  . HERNIA REPAIR  1991    Medical History: Past Medical History:  Diagnosis Date   . Allergy    environmental  . Diabetes mellitus without complication ([place])     Family History: Family History  Problem Relation Age of Onset  . Diabetes Mother   . Hyperlipidemia Mother   . Hypertension Mother   . Cancer Father   . Diabetes Father   . Hyperlipidemia Father   . Hypertension Father   . Heart disease Father   . Diabetes Brother   . Diabetes Brother       Review of Systems  Constitutional: Negative for activity change, chills, fatigue and unexpected weight change.  HENT: Negative for congestion, postnasal drip, rhinorrhea, sneezing and sore throat.   Respiratory: Negative for cough, chest tightness, shortness of breath and wheezing.   Cardiovascular: Negative for chest pain and palpitations.  Gastrointestinal: Negative for abdominal pain, constipation, diarrhea and nausea.  Endocrine: Negative for cold intolerance, heat intolerance, polydipsia and polyuria.       Blood sugars doing well   Genitourinary: Negative for flank pain, frequency, hematuria and testicular pain.  Musculoskeletal: Negative for arthralgias, back pain, joint swelling and neck pain.  Skin: Negative for rash.  Allergic/Immunologic: Negative for environmental allergies.  Neurological: Negative for dizziness, tremors, numbness and headaches.  Hematological: Negative for adenopathy. Does not bruise/bleed easily.  Psychiatric/Behavioral: Negative for behavioral problems (Depression), sleep disturbance and suicidal ideas. The patient is not nervous/anxious.      Today's  Vitals   02/23/19 1029  BP: (!) 143/84  Pulse: 66  Resp: 16  SpO2: 98%  Weight: 184 lb (83.5 kg)  Height: 5\' 5"  (1.651 m)   Body mass index is 30.62 kg/m.  Physical Exam Vitals signs and nursing note reviewed.  Constitutional:      General: He is not in acute distress.    Appearance: Normal appearance. He is well-developed. He is not diaphoretic.  HENT:     Head: Normocephalic and atraumatic.     Nose: Nose  normal.     Mouth/Throat:     Mouth: Mucous membranes are moist.     Pharynx: Oropharynx is clear. No oropharyngeal exudate.  Eyes:     Pupils: Pupils are equal, round, and reactive to light.  Neck:     Musculoskeletal: Normal range of motion and neck supple.     Thyroid: No thyromegaly.     Vascular: No JVD.     Trachea: No tracheal deviation.  Cardiovascular:     Rate and Rhythm: Normal rate and regular rhythm.     Pulses: Normal pulses.          Dorsalis pedis pulses are 2+ on the right side and 2+ on the left side.       Posterior tibial pulses are 2+ on the right side and 2+ on the left side.     Heart sounds: Normal heart sounds. No murmur. No friction rub. No gallop.   Pulmonary:     Effort: Pulmonary effort is normal. No respiratory distress.     Breath sounds: Normal breath sounds. No wheezing or rales.  Chest:     Chest wall: No tenderness.  Abdominal:     General: Bowel sounds are normal.     Palpations: Abdomen is soft.     Tenderness: There is no abdominal tenderness.  Musculoskeletal: Normal range of motion.     Right foot: Normal range of motion. No deformity.     Left foot: Normal range of motion. No deformity.  Feet:     Right foot:     Protective Sensation: 10 sites tested. 10 sites sensed.     Skin integrity: Skin integrity normal.     Toenail Condition: Right toenails are normal.     Left foot:     Protective Sensation: 10 sites tested. 10 sites sensed.     Skin integrity: Skin integrity normal.     Toenail Condition: Left toenails are normal.  Lymphadenopathy:     Cervical: No cervical adenopathy.  Skin:    General: Skin is warm and dry.  Neurological:     Mental Status: He is alert and oriented to person, place, and time.     Cranial Nerves: No cranial nerve deficit.  Psychiatric:        Behavior: Behavior normal.        Thought Content: Thought content normal.        Judgment: Judgment normal.      LABS: Recent Results (from the past 2160  hour(s))  POCT HgB A1C     Status: Abnormal   Collection Time: 03/04/19 12:05 PM  Result Value Ref Range   Hemoglobin A1C 7.9 (A) 4.0 - 5.6 %   HbA1c POC (<> result, manual entry)     HbA1c, POC (prediabetic range)     HbA1c, POC (controlled diabetic range)     Assessment/Plan: 1. Encounter for general adult medical examination with abnormal findings Annual health maintenance exam today.   2.  Type 2 diabetes mellitus with hyperglycemia, unspecified whether long term insulin use (HCC) - POCT HgB A1C 7.9 today. Continue all diabetic medication as prescribed.  - metFORMIN (GLUCOPHAGE) 500 MG tablet; Take 1.5 tablets (750 mg total) by mouth 2 (two) times daily with a meal.  Dispense: 270 tablet; Refill: 1  3. Gastroesophageal reflux disease without esophagitis Continue omeprazole as prescribed   General Counseling: Eman verbalizes understanding of the findings of todays visit and agrees with plan of treatment. I have discussed any further diagnostic evaluation that may be needed or ordered today. We also reviewed his medications today. he has been encouraged to call the office with any questions or concerns that should arise related to todays visit.    Counseling:  Diabetes Counseling:  1. Addition of ACE inh/ ARB'S for nephroprotection. Microalbumin is updated  2. Diabetic foot care, prevention of complications. Podiatry consult 3. Exercise and lose weight.  4. Diabetic eye examination, Diabetic eye exam is updated  5. Monitor blood sugar closlely. nutrition counseling.  6. Sign and symptoms of hypoglycemia including shaking sweating,confusion and headaches.  This patient was seen by Vincent GrosHeather Journii Nierman FNP Collaboration with Dr Lyndon CodeFozia M Khan as a part of collaborative care agreement  Orders Placed This Encounter  Procedures  . POCT HgB A1C    Meds ordered this encounter  Medications  . metFORMIN (GLUCOPHAGE) 500 MG tablet    Sig: Take 1.5 tablets (750 mg total) by mouth 2  (two) times daily with a meal.    Dispense:  270 tablet    Refill:  1    Please note increased dose    Order Specific Question:   Supervising Provider    Answer:   Lyndon CodeKHAN, FOZIA M [[id number]]    Time spent: 6530 Minutes      Lyndon CodeFozia M Khan, MD  Internal Medicine

## 2019-03-04 DIAGNOSIS — E1165 Type 2 diabetes mellitus with hyperglycemia: Secondary | ICD-10-CM | POA: Insufficient documentation

## 2019-03-04 LAB — POCT GLYCOSYLATED HEMOGLOBIN (HGB A1C): Hemoglobin A1C: 7.9 % — AB (ref 4.0–5.6)

## 2019-04-13 ENCOUNTER — Other Ambulatory Visit: Payer: Self-pay | Admitting: Internal Medicine

## 2019-04-13 DIAGNOSIS — E1165 Type 2 diabetes mellitus with hyperglycemia: Secondary | ICD-10-CM

## 2019-05-15 ENCOUNTER — Other Ambulatory Visit: Payer: Self-pay | Admitting: Nurse Practitioner

## 2019-05-15 DIAGNOSIS — J0140 Acute pansinusitis, unspecified: Secondary | ICD-10-CM

## 2019-05-15 MED ORDER — AZITHROMYCIN 250 MG PO TABS: ORAL_TABLET | ORAL | 0 refills | Status: DC

## 2019-05-15 NOTE — Progress Notes (Signed)
Patient with symptoms of acute sinusitis. Positive exposure to COVID 19. Sent z-pack to [facility] in [place].

## 2019-05-17 ENCOUNTER — Other Ambulatory Visit: Payer: Self-pay

## 2019-05-17 DIAGNOSIS — Z20822 Contact with and (suspected) exposure to covid-19: Secondary | ICD-10-CM

## 2019-05-20 LAB — NOVEL CORONAVIRUS, NAA: SARS-CoV-2, NAA: DETECTED — AB

## 2019-05-27 ENCOUNTER — Ambulatory Visit: Payer: 59 | Admitting: Adult Health

## 2019-05-27 ENCOUNTER — Other Ambulatory Visit: Payer: Self-pay

## 2019-05-27 ENCOUNTER — Encounter: Payer: Self-pay | Admitting: Adult Health

## 2019-05-27 VITALS — Temp 97.9°F

## 2019-05-27 DIAGNOSIS — R059 Cough, unspecified: Secondary | ICD-10-CM

## 2019-05-27 DIAGNOSIS — R05 Cough: Secondary | ICD-10-CM

## 2019-05-27 DIAGNOSIS — K219 Gastro-esophageal reflux disease without esophagitis: Secondary | ICD-10-CM | POA: Diagnosis not present

## 2019-05-27 DIAGNOSIS — E1165 Type 2 diabetes mellitus with hyperglycemia: Secondary | ICD-10-CM | POA: Diagnosis not present

## 2019-05-27 NOTE — Progress Notes (Signed)
[facility] [address] [place], [place] [address]  Internal MEDICINE  Telephone Visit  Patient Name: Austin Hardin  [id number]  [id number]  Date of Service: 05/27/2019  I connected with the patient at [phone] by telephone and verified the patients identity using two identifiers.   I discussed the limitations, risks, security and privacy concerns of performing an evaluation and management service by telephone and the availability of in person appointments. I also discussed with the patient that there may be a patient responsible charge related to the service.  The patient expressed understanding and agrees to proceed.    Chief Complaint  Patient presents with  . Telephone Screen  . Telephone Assessment  . Cough    little cough    HPI  Pt seen via telephone.  He reports he does not know why he has an appt today.  He reports he has had a intermittent cough for many years. His wife made the appointment for him.  He denies any fever, sob or other issues.    Current Medication: Outpatient Encounter Medications as of 05/27/2019  Medication Sig  . ACCU-CHEK GUIDE test strip USE TO CHECK BS TWICE A DAY  . aspirin 81 MG tablet Take 81 mg by mouth daily.  . dapagliflozin propanediol (FARXIGA) 10 MG TABS tablet Take 10 mg by mouth daily.  . ergocalciferol (DRISDOL) 50000 units capsule Take 1 capsule (50,000 Units total) by mouth once a week.  . IRON PO Take 65 mg by mouth.  . metFORMIN (GLUCOPHAGE) 500 MG tablet Take 1.5 tablets (750 mg total) by mouth 2 (two) times daily with a meal.  . omeprazole (PRILOSEC) 40 MG capsule Take 1 capsule (40 mg total) by mouth as needed. Reported on 11/27/2015  . UNIFINE PENTIPS 32G X 4 MM MISC FOR USE WITH ONCE DAILY VICTOZA  . VICTOZA 18 MG/3ML SOPN INJECT 0.3 MLS (1.8 MG TOTAL) INTO THE SKIN DAILY. 90 DAYS SUPPLY OK TO FILL  . azithromycin (ZITHROMAX) 250 MG tablet z-pack - take as directed for 5 days for sinusitis. (Patient not taking:  Reported on 05/27/2019)   No facility-administered encounter medications on file as of 05/27/2019.    Surgical History: Past Surgical History:  Procedure Laterality Date  . HERNIA REPAIR  1991    Medical History: Past Medical History:  Diagnosis Date  . Allergy    environmental  . Diabetes mellitus without complication (HCC)     Family History: Family History  Problem Relation Age of Onset  . Diabetes Mother   . Hyperlipidemia Mother   . Hypertension Mother   . Cancer Father   . Diabetes Father   . Hyperlipidemia Father   . Hypertension Father   . Heart disease Father   . Diabetes Brother   . Diabetes Brother     Social History   Socioeconomic History  . Marital status: Married    Spouse name: Not on file  . Number of children: Not on file  . Years of education: Not on file  . Highest education level: Not on file  Occupational History  . Not on file  Tobacco Use  . Smoking status: Never Smoker  . Smokeless tobacco: Never Used  Substance and Sexual Activity  . Alcohol use: No    Alcohol/week: 0.0 standard drinks  . Drug use: No  . Sexual activity: Not on file  Other Topics Concern  . Not on file  Social History Narrative  . Not on file   Social Determinants  of Health   Financial Resource Strain:   . Difficulty of Paying Living Expenses: Not on file  Food Insecurity:   . Worried About Charity fundraiser in the Last Year: Not on file  . Ran Out of Food in the Last Year: Not on file  Transportation Needs:   . Lack of Transportation (Medical): Not on file  . Lack of Transportation (Non-Medical): Not on file  Physical Activity:   . Days of Exercise per Week: Not on file  . Minutes of Exercise per Session: Not on file  Stress:   . Feeling of Stress : Not on file  Social Connections:   . Frequency of Communication with Friends and Family: Not on file  . Frequency of Social Gatherings with Friends and Family: Not on file  . Attends Religious  Services: Not on file  . Active Member of Clubs or Organizations: Not on file  . Attends Archivist Meetings: Not on file  . Marital Status: Not on file  Intimate Partner Violence:   . Fear of Current or Ex-Partner: Not on file  . Emotionally Abused: Not on file  . Physically Abused: Not on file  . Sexually Abused: Not on file      Review of Systems  Constitutional: Negative.  Negative for chills, fatigue and unexpected weight change.  HENT: Negative.  Negative for congestion, rhinorrhea, sneezing and sore throat.   Eyes: Negative for redness.  Respiratory: Positive for cough. Negative for chest tightness and shortness of breath.   Cardiovascular: Negative.  Negative for chest pain and palpitations.  Gastrointestinal: Negative.  Negative for abdominal pain, constipation, diarrhea, nausea and vomiting.  Endocrine: Negative.   Genitourinary: Negative.  Negative for dysuria and frequency.  Musculoskeletal: Negative.  Negative for arthralgias, back pain, joint swelling and neck pain.  Skin: Negative.  Negative for rash.  Allergic/Immunologic: Negative.   Neurological: Negative.  Negative for tremors and numbness.  Hematological: Negative for adenopathy. Does not bruise/bleed easily.  Psychiatric/Behavioral: Negative.  Negative for behavioral problems, sleep disturbance and suicidal ideas. The patient is not nervous/anxious.     Vital Signs: Temp 97.9 F (36.6 C)    Observation/Objective:  Well sounding, NAD noted.   Assessment/Plan: 1. Uncontrolled type 2 diabetes mellitus with hyperglycemia (HCC) Continue current therapy.   2. Gastroesophageal reflux disease without esophagitis Stable, continue present management.  3. Cough Will get chest x-ray.  - DG Chest 2 View; Future  General Counseling: [name] verbalizes understanding of the findings of today's phone visit and agrees with plan of treatment. I have discussed any further diagnostic evaluation that may  be needed or ordered today. We also reviewed his medications today. he has been encouraged to call the office with any questions or concerns that should arise related to todays visit.    Orders Placed This Encounter  Procedures  . DG Chest 2 View    No orders of the defined types were placed in this encounter.   Time spent: [place] AGNP-C Internal medicine

## 2019-06-17 ENCOUNTER — Telehealth: Payer: Self-pay

## 2019-06-17 NOTE — Telephone Encounter (Signed)
CONFIRMED AND SCREENED FOR 06-22-19 OV. °

## 2019-06-22 ENCOUNTER — Encounter: Payer: Self-pay | Admitting: Nurse Practitioner

## 2019-06-22 ENCOUNTER — Other Ambulatory Visit: Payer: Self-pay

## 2019-06-22 ENCOUNTER — Ambulatory Visit: Payer: 59 | Admitting: Nurse Practitioner

## 2019-06-22 VITALS — BP 122/86 | HR 80 | Temp 97.8°F | Resp 16 | Ht 65.0 in | Wt 179.0 lb

## 2019-06-22 DIAGNOSIS — B372 Candidiasis of skin and nail: Secondary | ICD-10-CM | POA: Diagnosis not present

## 2019-06-22 DIAGNOSIS — E1165 Type 2 diabetes mellitus with hyperglycemia: Secondary | ICD-10-CM | POA: Diagnosis not present

## 2019-06-22 DIAGNOSIS — R030 Elevated blood-pressure reading, without diagnosis of hypertension: Secondary | ICD-10-CM | POA: Diagnosis not present

## 2019-06-22 LAB — POCT GLYCOSYLATED HEMOGLOBIN (HGB A1C): Hemoglobin A1C: 8 % — AB (ref 4.0–5.6)

## 2019-06-22 MED ORDER — NYSTATIN 100000 UNIT/GM EX OINT: 1.0000 "application " | TOPICAL_OINTMENT | Freq: Two times a day (BID) | CUTANEOUS | 2 refills | Status: AC

## 2019-06-22 MED ORDER — METFORMIN HCL 500 MG PO TABS: ORAL_TABLET | ORAL | 1 refills | Status: DC

## 2019-06-22 NOTE — Progress Notes (Signed)
[facility] [place], [place] [address]  Internal MEDICINE  Office Visit Note  Patient Name: Austin Hardin  [id number]  [id number]  Date of Service: 06/23/2019  Chief Complaint  Patient presents with  . Diabetes    farxiga causing yeast infection    The patient is here for follow up visit. He states that his blood sugars are improving. He is taking metformin 500mg ,two tablets in the morning and one in the evening. He is also taking farxiga 10mg  daily and victoza 1.8mg  daily. His HgbA1c is 8.0 today, up from 7.9 at the past two checks of his HgbA1c. He has tolerated the addition of the farxiga well. Has noted some itching and rash in the groin area.  Blood pressure is generally well controlled.       Current Medication: Outpatient Encounter Medications as of 06/22/2019  Medication Sig  . ACCU-CHEK GUIDE test strip USE TO CHECK BS TWICE A DAY  . aspirin 81 MG tablet Take 81 mg by mouth daily.  . dapagliflozin propanediol (FARXIGA) 10 MG TABS tablet Take 10 mg by mouth daily.  . IRON PO Take 65 mg by mouth.  . metFORMIN (GLUCOPHAGE) 500 MG tablet Take two tablets po QAM and two tablets po QPM  . omeprazole (PRILOSEC) 40 MG capsule Take 1 capsule (40 mg total) by mouth as needed. Reported on 11/27/2015  . UNIFINE PENTIPS 32G X 4 MM MISC FOR USE WITH ONCE DAILY VICTOZA  . VICTOZA 18 MG/3ML SOPN INJECT 0.3 MLS (1.8 MG TOTAL) INTO THE SKIN DAILY. 90 DAYS SUPPLY OK TO FILL  . [DISCONTINUED] metFORMIN (GLUCOPHAGE) 500 MG tablet Take 1.5 tablets (750 mg total) by mouth 2 (two) times daily with a meal.  . ergocalciferol (DRISDOL) 50000 units capsule Take 1 capsule (50,000 Units total) by mouth once a week. (Patient not taking: Reported on 06/22/2019)  . nystatin ointment (MYCOSTATIN) Apply 1 application topically 2 (two) times daily.  . [DISCONTINUED] azithromycin (ZITHROMAX) 250 MG tablet z-pack - take as directed for 5 days for sinusitis. (Patient not taking:  Reported on 05/27/2019)   No facility-administered encounter medications on file as of 06/22/2019.    Surgical History: Past Surgical History:  Procedure Laterality Date  . HERNIA REPAIR  1991    Medical History: Past Medical History:  Diagnosis Date  . Allergy    environmental  . Diabetes mellitus without complication ([place])     Family History: Family History  Problem Relation Age of Onset  . Diabetes Mother   . Hyperlipidemia Mother   . Hypertension Mother   . Cancer Father   . Diabetes Father   . Hyperlipidemia Father   . Hypertension Father   . Heart disease Father   . Diabetes Brother   . Diabetes Brother     Social History   Socioeconomic History  . Marital status: Married    Spouse name: Not on file  . Number of children: Not on file  . Years of education: Not on file  . Highest education level: Not on file  Occupational History  . Not on file  Tobacco Use  . Smoking status: Never Smoker  . Smokeless tobacco: Never Used  Substance and Sexual Activity  . Alcohol use: No    Alcohol/week: 0.0 standard drinks  . Drug use: No  . Sexual activity: Not on file  Other Topics Concern  . Not on file  Social History Narrative  . Not on file   Social Determinants of Health  Financial Resource Strain:   . Difficulty of Paying Living Expenses: Not on file  Food Insecurity:   . Worried About Programme researcher, broadcasting/film/video in the Last Year: Not on file  . Ran Out of Food in the Last Year: Not on file  Transportation Needs:   . Lack of Transportation (Medical): Not on file  . Lack of Transportation (Non-Medical): Not on file  Physical Activity:   . Days of Exercise per Week: Not on file  . Minutes of Exercise per Session: Not on file  Stress:   . Feeling of Stress : Not on file  Social Connections:   . Frequency of Communication with Friends and Family: Not on file  . Frequency of Social Gatherings with Friends and Family: Not on file  . Attends Religious Services:  Not on file  . Active Member of Clubs or Organizations: Not on file  . Attends Banker Meetings: Not on file  . Marital Status: Not on file  Intimate Partner Violence:   . Fear of Current or Ex-Partner: Not on file  . Emotionally Abused: Not on file  . Physically Abused: Not on file  . Sexually Abused: Not on file      Review of Systems  Constitutional: Negative for activity change, chills, fatigue and unexpected weight change.  HENT: Negative for congestion, postnasal drip, rhinorrhea, sneezing and sore throat.   Respiratory: Negative for cough, chest tightness, shortness of breath and wheezing.   Cardiovascular: Negative for chest pain and palpitations.  Gastrointestinal: Negative for abdominal pain, constipation, diarrhea and nausea.  Endocrine: Negative for cold intolerance, heat intolerance, polydipsia and polyuria.       Blood sugars elevated past few months.   Genitourinary:       Rash and itching in groin area.   Musculoskeletal: Negative for arthralgias, back pain, joint swelling and neck pain.  Skin: Positive for rash.  Allergic/Immunologic: Negative for environmental allergies.  Neurological: Negative for dizziness, tremors, numbness and headaches.  Hematological: Negative for adenopathy. Does not bruise/bleed easily.  Psychiatric/Behavioral: Negative for behavioral problems (Depression), sleep disturbance and suicidal ideas. The patient is not nervous/anxious.     Today's Vitals   06/22/19 1026  BP: 122/86  Pulse: 80  Resp: 16  Temp: 97.8 F (36.6 C)  SpO2: 99%  Weight: 179 lb (81.2 kg)  Height: 5\' 5"  (1.651 m)   Body mass index is 29.79 kg/m.  Physical Exam Vitals and nursing note reviewed.  Constitutional:      General: He is not in acute distress.    Appearance: Normal appearance. He is well-developed. He is not diaphoretic.  HENT:     Head: Normocephalic and atraumatic.     Nose: Nose normal.     Mouth/Throat:     Pharynx: No  oropharyngeal exudate.  Eyes:     Pupils: Pupils are equal, round, and reactive to light.  Neck:     Thyroid: No thyromegaly.     Vascular: No JVD.     Trachea: No tracheal deviation.  Cardiovascular:     Rate and Rhythm: Regular rhythm.     Heart sounds: Normal heart sounds. No murmur. No friction rub. No gallop.   Pulmonary:     Effort: Pulmonary effort is normal. No respiratory distress.     Breath sounds: Normal breath sounds. No wheezing or rales.  Chest:     Chest wall: No tenderness.  Abdominal:     Palpations: Abdomen is soft.  Musculoskeletal:  General: Normal range of motion.     Cervical back: Normal range of motion and neck supple.  Lymphadenopathy:     Cervical: No cervical adenopathy.  Skin:    General: Skin is warm and dry.  Neurological:     Mental Status: He is alert and oriented to person, place, and time.     Cranial Nerves: No cranial nerve deficit.  Psychiatric:        Behavior: Behavior normal.        Thought Content: Thought content normal.        Judgment: Judgment normal.    Assessment/Plan:  1. Uncontrolled type 2 diabetes mellitus with hyperglycemia (HCC) - POCT HgB A1C 8.0, up from 7.9 at last check. Increase metformin to 2 tablets twice daily. Continue victoza and farxiga as prescribed   2. Elevated blood-pressure reading without diagnosis of hypertension Continue to monitor.  3. Cutaneous candidiasis May apply nystatin twice daily to affected areas as needed.  - nystatin ointment (MYCOSTATIN); Apply 1 application topically 2 (two) times daily.  Dispense: 30 g; Refill: 2    General Counseling: Rusty verbalizes understanding of the findings of todays visit and agrees with plan of treatment. I have discussed any further diagnostic evaluation that may be needed or ordered today. We also reviewed his medications today. he has been encouraged to call the office with any questions or concerns that should arise related to todays  visit.  Diabetes Counseling:  1. Addition of ACE inh/ ARB'S for nephroprotection. Microalbumin is updated  2. Diabetic foot care, prevention of complications. Podiatry consult 3. Exercise and lose weight.  4. Diabetic eye examination, Diabetic eye exam is updated  5. Monitor blood sugar closlely. nutrition counseling.  6. Sign and symptoms of hypoglycemia including shaking sweating,confusion and headaches.  This patient was seen by Vincent Gros FNP Collaboration with Dr Lyndon Code as a part of collaborative care agreement  Orders Placed This Encounter  Procedures  . POCT HgB A1C    Meds ordered this encounter  Medications  . metFORMIN (GLUCOPHAGE) 500 MG tablet    Sig: Take two tablets po QAM and two tablets po QPM    Dispense:  360 tablet    Refill:  1    Please note increased dose    Order Specific Question:   Supervising Provider    Answer:   Lyndon Code [[id number]]  . nystatin ointment (MYCOSTATIN)    Sig: Apply 1 application topically 2 (two) times daily.    Dispense:  30 g    Refill:  2    Order Specific Question:   Supervising Provider    Answer:   Lyndon Code [[id number]]    Total time spent: 30 Minutes   Time spent includes review of chart, medications, test results, and follow up plan with the patient.      Dr Lyndon Code Internal medicine

## 2019-06-23 DIAGNOSIS — R030 Elevated blood-pressure reading, without diagnosis of hypertension: Secondary | ICD-10-CM | POA: Insufficient documentation

## 2019-06-23 DIAGNOSIS — B372 Candidiasis of skin and nail: Secondary | ICD-10-CM | POA: Insufficient documentation

## 2019-07-06 ENCOUNTER — Other Ambulatory Visit: Payer: Self-pay | Admitting: Internal Medicine

## 2019-08-30 ENCOUNTER — Ambulatory Visit: Payer: Self-pay

## 2019-09-06 ENCOUNTER — Ambulatory Visit: Payer: Self-pay

## 2019-09-13 ENCOUNTER — Ambulatory Visit: Payer: 59 | Attending: Internal Medicine

## 2019-09-13 DIAGNOSIS — Z23 Encounter for immunization: Secondary | ICD-10-CM

## 2019-09-13 NOTE — Progress Notes (Signed)
   Covid-19 Vaccination Clinic  Name:  Austin Hardin    MRN: [id number] DOB: 30-Dec-1965  09/13/2019  [name] was observed post Covid-19 immunization for 15 minutes without incident. He was provided with Vaccine Information Sheet and instruction to access the V-Safe system.   [name] was instructed to call 911 with any severe reactions post vaccine: Marland Kitchen Difficulty breathing  . Swelling of face and throat  . A fast heartbeat  . A bad rash all over body  . Dizziness and weakness   Immunizations Administered    Name Date Dose VIS Date Route   Pfizer COVID-19 Vaccine 09/13/2019  8:13 AM 0.3 mL 05/21/2019 Intramuscular   Manufacturer: [employer], [employer]   Lot: [id number]   NDC: [address]-5

## 2019-09-16 ENCOUNTER — Telehealth: Payer: Self-pay

## 2019-09-16 NOTE — Telephone Encounter (Signed)
Confirmed and screened for 09-20-19 ov. 

## 2019-09-20 ENCOUNTER — Encounter: Payer: Self-pay | Admitting: Nurse Practitioner

## 2019-09-20 ENCOUNTER — Ambulatory Visit: Payer: 59 | Admitting: Nurse Practitioner

## 2019-09-20 ENCOUNTER — Other Ambulatory Visit: Payer: Self-pay

## 2019-09-20 VITALS — BP 132/85 | HR 67 | Temp 97.5°F | Resp 16 | Ht 65.0 in | Wt 181.2 lb

## 2019-09-20 DIAGNOSIS — E1165 Type 2 diabetes mellitus with hyperglycemia: Secondary | ICD-10-CM

## 2019-09-20 DIAGNOSIS — R030 Elevated blood-pressure reading, without diagnosis of hypertension: Secondary | ICD-10-CM

## 2019-09-20 LAB — POCT GLYCOSYLATED HEMOGLOBIN (HGB A1C): Hemoglobin A1C: 8 % — AB (ref 4.0–5.6)

## 2019-09-20 MED ORDER — OZEMPIC (0.25 OR 0.5 MG/DOSE) 2 MG/1.5ML ~~LOC~~ SOPN: 0.5000 mg | PEN_INJECTOR | SUBCUTANEOUS | 3 refills | Status: DC

## 2019-09-20 NOTE — Progress Notes (Signed)
[facility] [place], [place] [address]  Internal MEDICINE  Office Visit Note  Patient Name: Austin Hardin  [id number]  [id number]  Date of Service: 09/29/2019  Chief Complaint  Patient presents with  . Diabetes    The patient is here for follow up of diabetes. Increased his metformin dose to 1000mg  twice daily at his last visit. Continues victoza 1.8mg  daily and farxiga 10mg  daily. States that diet choices are not always the best. Does eat a lot of starchy type foods. His HgbA1c is 8.0 which is the same as last check. He states he takes all of his medications as prescribed. He has no concerns or complaints today.       Current Medication: Outpatient Encounter Medications as of 09/20/2019  Medication Sig Note  . ACCU-CHEK GUIDE test strip USE TO CHECK BS TWICE A DAY   . aspirin 81 MG tablet Take 81 mg by mouth daily.   Marland Kitchen FARXIGA 10 MG TABS tablet TAKE 1 TABLET BY MOUTH ONCE DAILY   . IRON PO Take 65 mg by mouth.   . metFORMIN (GLUCOPHAGE) 500 MG tablet Take two tablets po QAM and two tablets po QPM   . nystatin ointment (MYCOSTATIN) Apply 1 application topically 2 (two) times daily.   Marland Kitchen omeprazole (PRILOSEC) 40 MG capsule Take 1 capsule (40 mg total) by mouth as needed. Reported on 11/27/2015   . UNIFINE PENTIPS 32G X 4 MM MISC FOR USE WITH ONCE DAILY VICTOZA   . [DISCONTINUED] VICTOZA 18 MG/3ML SOPN INJECT 0.3 MLS (1.8 MG TOTAL) INTO THE SKIN DAILY. [place] TO FILL 09/20/2019: change to ozempic 0.5mg  weekly.   . Semaglutide,0.25 or 0.5MG /DOS, (OZEMPIC, 0.25 OR 0.5 MG/DOSE,) 2 MG/1.5ML SOPN Inject 0.5 mg into the skin once a week.   . [DISCONTINUED] ergocalciferol (DRISDOL) 50000 units capsule Take 1 capsule (50,000 Units total) by mouth once a week. (Patient not taking: Reported on 09/20/2019)    No facility-administered encounter medications on file as of 09/20/2019.    Surgical History: Past Surgical History:  Procedure Laterality Date  .  HERNIA REPAIR  1991    Medical History: Past Medical History:  Diagnosis Date  . Allergy    environmental  . Diabetes mellitus without complication ([place])     Family History: Family History  Problem Relation Age of Onset  . Diabetes Mother   . Hyperlipidemia Mother   . Hypertension Mother   . Cancer Father   . Diabetes Father   . Hyperlipidemia Father   . Hypertension Father   . Heart disease Father   . Diabetes Brother   . Diabetes Brother     Social History   Socioeconomic History  . Marital status: Married    Spouse name: Not on file  . Number of children: Not on file  . Years of education: Not on file  . Highest education level: Not on file  Occupational History  . Not on file  Tobacco Use  . Smoking status: Never Smoker  . Smokeless tobacco: Never Used  Substance and Sexual Activity  . Alcohol use: No    Alcohol/week: 0.0 standard drinks  . Drug use: No  . Sexual activity: Not on file  Other Topics Concern  . Not on file  Social History Narrative  . Not on file   Social Determinants of Health   Financial Resource Strain:   . Difficulty of Paying Living Expenses:   Food Insecurity:   . Worried About  Running Out of Food in the Last Year:   . Ran Out of Food in the Last Year:   Transportation Needs:   . Lack of Transportation (Medical):   Marland Kitchen Lack of Transportation (Non-Medical):   Physical Activity:   . Days of Exercise per Week:   . Minutes of Exercise per Session:   Stress:   . Feeling of Stress :   Social Connections:   . Frequency of Communication with Friends and Family:   . Frequency of Social Gatherings with Friends and Family:   . Attends Religious Services:   . Active Member of Clubs or Organizations:   . Attends Banker Meetings:   Marland Kitchen Marital Status:   Intimate Partner Violence:   . Fear of Current or Ex-Partner:   . Emotionally Abused:   Marland Kitchen Physically Abused:   . Sexually Abused:       Review of Systems   Constitutional: Negative for activity change, chills, fatigue and unexpected weight change.  HENT: Negative for congestion, postnasal drip, rhinorrhea, sneezing and sore throat.   Respiratory: Negative for cough, chest tightness, shortness of breath and wheezing.   Cardiovascular: Negative for chest pain and palpitations.  Gastrointestinal: Negative for abdominal pain, constipation, diarrhea and nausea.  Endocrine: Negative for cold intolerance, heat intolerance, polydipsia and polyuria.       Blood sugars elevated past few months.   Genitourinary:       Rash and itching in groin area.   Musculoskeletal: Negative for arthralgias, back pain, joint swelling and neck pain.  Skin: Negative for rash.  Allergic/Immunologic: Negative for environmental allergies.  Neurological: Negative for dizziness, tremors, numbness and headaches.  Hematological: Negative for adenopathy. Does not bruise/bleed easily.  Psychiatric/Behavioral: Negative for behavioral problems (Depression), sleep disturbance and suicidal ideas. The patient is not nervous/anxious.    Today's Vitals   09/20/19 1027  BP: 132/85  Pulse: 67  Resp: 16  Temp: (!) 97.5 F (36.4 C)  SpO2: 99%  Weight: 181 lb 3.2 oz (82.2 kg)  Height: 5\' 5"  (1.651 m)   Body mass index is 30.15 kg/m.  Physical Exam Vitals and nursing note reviewed.  Constitutional:      General: He is not in acute distress.    Appearance: Normal appearance. He is well-developed. He is not diaphoretic.  HENT:     Head: Normocephalic and atraumatic.     Nose: Nose normal.     Mouth/Throat:     Pharynx: No oropharyngeal exudate.  Eyes:     Pupils: Pupils are equal, round, and reactive to light.  Neck:     Thyroid: No thyromegaly.     Vascular: No JVD.     Trachea: No tracheal deviation.  Cardiovascular:     Rate and Rhythm: Regular rhythm.     Heart sounds: Normal heart sounds. No murmur. No friction rub. No gallop.   Pulmonary:     Effort: Pulmonary  effort is normal. No respiratory distress.     Breath sounds: Normal breath sounds. No wheezing or rales.  Chest:     Chest wall: No tenderness.  Abdominal:     Palpations: Abdomen is soft.  Musculoskeletal:        General: Normal range of motion.     Cervical back: Normal range of motion and neck supple.  Lymphadenopathy:     Cervical: No cervical adenopathy.  Skin:    General: Skin is warm and dry.  Neurological:     Mental Status: He is alert  and oriented to person, place, and time.     Cranial Nerves: No cranial nerve deficit.  Psychiatric:        Mood and Affect: Mood normal.        Behavior: Behavior normal.        Thought Content: Thought content normal.        Judgment: Judgment normal.   Assessment/Plan: 1. Type 2 diabetes mellitus with hyperglycemia, without long-term current use of insulin (HCC) - POCT HgB A1C 8.0, unchanged from his last check. D/c victoza. Start ozempic 0.5mg  weekly. Continue other diabetic medications as prescribed.  - Semaglutide,0.25 or 0.5MG /DOS, (OZEMPIC, 0.25 OR 0.5 MG/DOSE,) 2 MG/1.5ML SOPN; Inject 0.5 mg into the skin once a week.  Dispense: 1 pen; Refill: 3  2. Elevated blood-pressure reading without diagnosis of hypertension Stable. Without use of medications. Will continue to monitor closely.   General Counseling: Rawad verbalizes understanding of the findings of todays visit and agrees with plan of treatment. I have discussed any further diagnostic evaluation that may be needed or ordered today. We also reviewed his medications today. he has been encouraged to call the office with any questions or concerns that should arise related to todays visit.  Diabetes Counseling:  1. Addition of ACE inh/ ARB'S for nephroprotection. Microalbumin is updated  2. Diabetic foot care, prevention of complications. Podiatry consult 3. Exercise and lose weight.  4. Diabetic eye examination, Diabetic eye exam is updated  5. Monitor blood sugar closlely.  nutrition counseling.  6. Sign and symptoms of hypoglycemia including shaking sweating,confusion and headaches.  This patient was seen by Vincent Gros FNP Collaboration with Dr Lyndon Code as a part of collaborative care agreement  Orders Placed This Encounter  Procedures  . POCT HgB A1C    Meds ordered this encounter  Medications  . Semaglutide,0.25 or 0.5MG /DOS, (OZEMPIC, 0.25 OR 0.5 MG/DOSE,) 2 MG/1.5ML SOPN    Sig: Inject 0.5 mg into the skin once a week.    Dispense:  1 pen    Refill:  3    D/c victoza. Not effective. Sample provided today.    Order Specific Question:   Supervising Provider    Answer:   Lyndon Code [[id number]]    Total time spent: 30 Minutes   Time spent includes review of chart, medications, test results, and follow up plan with the patient.      Dr Lyndon Code Internal medicine

## 2019-10-06 ENCOUNTER — Ambulatory Visit: Payer: 59 | Attending: Internal Medicine

## 2019-10-06 DIAGNOSIS — Z23 Encounter for immunization: Secondary | ICD-10-CM

## 2019-10-06 NOTE — Progress Notes (Signed)
   Covid-19 Vaccination Clinic  Name:  [name]    MRN: [id number] DOB: September 01, 1965  10/06/2019  Mr. Flinn was observed post Covid-19 immunization for 15 minutes without incident. He was provided with Vaccine Information Sheet and instruction to access the V-Safe system.   Mr. Sweetman was instructed to call 911 with any severe reactions post vaccine: Marland Kitchen Difficulty breathing  . Swelling of face and throat  . A fast heartbeat  . A bad rash all over body  . Dizziness and weakness   Immunizations Administered    Name Date Dose VIS Date Route   Pfizer COVID-19 Vaccine 10/06/2019  2:28 PM 0.3 mL 08/04/2018 Intramuscular   Manufacturer: [employer], [employer]   Lot: [id number]   NDC: [address]-2

## 2019-12-16 ENCOUNTER — Telehealth: Payer: Self-pay

## 2019-12-16 NOTE — Telephone Encounter (Signed)
Confirmed and screened for 12-20-19 ov. 

## 2019-12-20 ENCOUNTER — Other Ambulatory Visit: Payer: Self-pay

## 2019-12-20 ENCOUNTER — Encounter: Payer: Self-pay | Admitting: Nurse Practitioner

## 2019-12-20 ENCOUNTER — Other Ambulatory Visit: Payer: Self-pay | Admitting: Nurse Practitioner

## 2019-12-20 ENCOUNTER — Ambulatory Visit: Payer: 59 | Admitting: Nurse Practitioner

## 2019-12-20 VITALS — BP 137/79 | HR 68 | Temp 97.6°F | Resp 16 | Ht 65.0 in | Wt 180.0 lb

## 2019-12-20 DIAGNOSIS — K219 Gastro-esophageal reflux disease without esophagitis: Secondary | ICD-10-CM | POA: Diagnosis not present

## 2019-12-20 DIAGNOSIS — Z23 Encounter for immunization: Secondary | ICD-10-CM | POA: Diagnosis not present

## 2019-12-20 DIAGNOSIS — E1165 Type 2 diabetes mellitus with hyperglycemia: Secondary | ICD-10-CM

## 2019-12-20 LAB — POCT GLYCOSYLATED HEMOGLOBIN (HGB A1C): Hemoglobin A1C: 7 % — AB (ref 4.0–5.6)

## 2019-12-20 MED ORDER — METFORMIN HCL 500 MG PO TABS: ORAL_TABLET | ORAL | 1 refills | Status: DC

## 2019-12-20 MED ORDER — TETANUS-DIPHTH-ACELL PERTUSSIS 5-2.5-18.5 LF-MCG/0.5 IM SUSP: 0.5000 mL | Freq: Once | INTRAMUSCULAR | 0 refills | Status: AC

## 2019-12-20 MED ORDER — OZEMPIC (0.25 OR 0.5 MG/DOSE) 2 MG/1.5ML ~~LOC~~ SOPN: 0.5000 mg | PEN_INJECTOR | SUBCUTANEOUS | 3 refills | Status: DC

## 2019-12-20 NOTE — Progress Notes (Signed)
[facility] [address] [place], [place] [address]  Internal MEDICINE  Office Visit Note  Patient Name: Austin Hardin  [id number]  [id number]  Date of Service: 12/20/2019  Chief Complaint  Patient presents with  . Follow-up  . Diabetes  . Hyperlipidemia  . Hypertension  . Quality Metric Gaps    TDAP    The patient is here for routine follow up visit. Blood sugar control has improved. Today. HgbA1c is 7.0, down from 8.0 at his last few checks. Doing well with current medications. No concerns or complaints today.  He is due to have routine, fasting labs done. Should also get TDAP booster vaccine.       Current Medication: Outpatient Encounter Medications as of 12/20/2019  Medication Sig  . ACCU-CHEK GUIDE test strip USE TO CHECK BS TWICE A DAY  . aspirin 81 MG tablet Take 81 mg by mouth daily.  Marland Kitchen FARXIGA 10 MG TABS tablet TAKE 1 TABLET BY MOUTH ONCE DAILY  . IRON PO Take 65 mg by mouth.  . metFORMIN (GLUCOPHAGE) 500 MG tablet Take two tablets po QAM and two tablets po QPM  . nystatin ointment (MYCOSTATIN) Apply 1 application topically 2 (two) times daily.  Marland Kitchen omeprazole (PRILOSEC) 40 MG capsule Take 1 capsule (40 mg total) by mouth as needed. Reported on 11/27/2015  . Semaglutide,0.25 or 0.5MG /DOS, (OZEMPIC, 0.25 OR 0.5 MG/DOSE,) 2 MG/1.5ML SOPN Inject 0.375 mLs (0.5 mg total) into the skin once a week.  Marland Kitchen UNIFINE PENTIPS 32G X 4 MM MISC FOR USE WITH ONCE DAILY VICTOZA  . [DISCONTINUED] metFORMIN (GLUCOPHAGE) 500 MG tablet Take two tablets po QAM and two tablets po QPM  . [DISCONTINUED] Semaglutide,0.25 or 0.5MG /DOS, (OZEMPIC, 0.25 OR 0.5 MG/DOSE,) 2 MG/1.5ML SOPN Inject 0.5 mg into the skin once a week.  . Tdap (BOOSTRIX) 5-2.5-18.5 LF-MCG/0.5 injection Inject 0.5 mLs into the muscle once for 1 dose.   No facility-administered encounter medications on file as of 12/20/2019.    Surgical History: Past Surgical History:  Procedure Laterality Date  . HERNIA  REPAIR  1991    Medical History: Past Medical History:  Diagnosis Date  . Allergy    environmental  . Diabetes mellitus without complication (HCC)     Family History: Family History  Problem Relation Age of Onset  . Diabetes Mother   . Hyperlipidemia Mother   . Hypertension Mother   . Cancer Father   . Diabetes Father   . Hyperlipidemia Father   . Hypertension Father   . Heart disease Father   . Diabetes Brother   . Diabetes Brother     Social History   Socioeconomic History  . Marital status: Married    Spouse name: Not on file  . Number of children: Not on file  . Years of education: Not on file  . Highest education level: Not on file  Occupational History  . Not on file  Tobacco Use  . Smoking status: Never Smoker  . Smokeless tobacco: Never Used  Vaping Use  . Vaping Use: Never used  Substance and Sexual Activity  . Alcohol use: No    Alcohol/week: 0.0 standard drinks  . Drug use: No  . Sexual activity: Not on file  Other Topics Concern  . Not on file  Social History Narrative  . Not on file   Social Determinants of Health   Financial Resource Strain:   . Difficulty of Paying Living Expenses:   Food Insecurity:   . Worried About  Running Out of Food in the Last Year:   . Ran Out of Food in the Last Year:   Transportation Needs:   . Lack of Transportation (Medical):   Marland Kitchen Lack of Transportation (Non-Medical):   Physical Activity:   . Days of Exercise per Week:   . Minutes of Exercise per Session:   Stress:   . Feeling of Stress :   Social Connections:   . Frequency of Communication with Friends and Family:   . Frequency of Social Gatherings with Friends and Family:   . Attends Religious Services:   . Active Member of Clubs or Organizations:   . Attends Banker Meetings:   Marland Kitchen Marital Status:   Intimate Partner Violence:   . Fear of Current or Ex-Partner:   . Emotionally Abused:   Marland Kitchen Physically Abused:   . Sexually Abused:        Review of Systems  Constitutional: Negative for activity change, chills, fatigue and unexpected weight change.  HENT: Negative for congestion, postnasal drip, rhinorrhea, sneezing and sore throat.   Respiratory: Negative for cough, chest tightness, shortness of breath and wheezing.   Cardiovascular: Negative for chest pain and palpitations.  Gastrointestinal: Negative for abdominal pain, constipation, diarrhea, nausea and vomiting.  Endocrine: Negative for cold intolerance, heat intolerance, polydipsia and polyuria.       Improved blood sugars  Musculoskeletal: Negative for arthralgias, back pain, joint swelling and neck pain.  Skin: Negative for rash.  Allergic/Immunologic: Negative for environmental allergies.  Neurological: Negative for dizziness, tremors, numbness and headaches.  Hematological: Negative for adenopathy. Does not bruise/bleed easily.  Psychiatric/Behavioral: Negative for behavioral problems (Depression), sleep disturbance and suicidal ideas. The patient is not nervous/anxious.     Today's Vitals   12/20/19 0943  BP: 137/79  Pulse: 68  Resp: 16  Temp: 97.6 F (36.4 C)  SpO2: 98%  Weight: 180 lb (81.6 kg)  Height: 5\' 5"  (1.651 m)   Body mass index is 29.95 kg/m.  Physical Exam Vitals and nursing note reviewed.  Constitutional:      General: He is not in acute distress.    Appearance: Normal appearance. He is well-developed. He is not diaphoretic.  HENT:     Head: Normocephalic and atraumatic.     Nose: Nose normal.     Mouth/Throat:     Pharynx: No oropharyngeal exudate.  Eyes:     Pupils: Pupils are equal, round, and reactive to light.  Neck:     Thyroid: No thyromegaly.     Vascular: No JVD.     Trachea: No tracheal deviation.  Cardiovascular:     Rate and Rhythm: Normal rate and regular rhythm.     Heart sounds: Normal heart sounds. No murmur heard.  No friction rub. No gallop.   Pulmonary:     Effort: Pulmonary effort is normal. No  respiratory distress.     Breath sounds: Normal breath sounds. No wheezing or rales.  Chest:     Chest wall: No tenderness.  Abdominal:     Palpations: Abdomen is soft.  Musculoskeletal:        General: Normal range of motion.     Cervical back: Normal range of motion and neck supple.  Lymphadenopathy:     Cervical: No cervical adenopathy.  Skin:    General: Skin is warm and dry.  Neurological:     General: No focal deficit present.     Mental Status: He is alert and oriented to person, place, and  time.     Cranial Nerves: No cranial nerve deficit.  Psychiatric:        Mood and Affect: Mood normal.        Behavior: Behavior normal.        Thought Content: Thought content normal.        Judgment: Judgment normal.    Assessment/Plan: 1. Type 2 diabetes mellitus with hyperglycemia, without long-term current use of insulin (HCC) - POCT HgB A1C 7.0 today, down from 8.0 at his last check. Continue diabetic medication as prescribed. Monitor blood sugar closely.  - Semaglutide,0.25 or 0.5MG /DOS, (OZEMPIC, 0.25 OR 0.5 MG/DOSE,) 2 MG/1.5ML SOPN; Inject 0.375 mLs (0.5 mg total) into the skin once a week.  Dispense: 3 pen; Refill: 3 - metFORMIN (GLUCOPHAGE) 500 MG tablet; Take two tablets po QAM and two tablets po QPM  Dispense: 360 tablet; Refill: 1  2. Gastroesophageal reflux disease without esophagitis Continue omeprazole as prescribed   3. Need for prophylactic vaccination with combined diphtheria-tetanus-pertussis (DTaP) vaccine Prescription for TDAP sent to his pharmacy for administration.  - Tdap (BOOSTRIX) 5-2.5-18.5 LF-MCG/0.5 injection; Inject 0.5 mLs into the muscle once for 1 dose.  Dispense: 0.5 mL; Refill: 0   General Counseling: Austin Hardin verbalizes understanding of the findings of todays visit and agrees with plan of treatment. I have discussed any further diagnostic evaluation that may be needed or ordered today. We also reviewed his medications today. he has been  encouraged to call the office with any questions or concerns that should arise related to todays visit.  Diabetes Counseling:  1. Addition of ACE inh/ ARB'S for nephroprotection. Microalbumin is updated  2. Diabetic foot care, prevention of complications. Podiatry consult 3. Exercise and lose weight.  4. Diabetic eye examination, Diabetic eye exam is updated  5. Monitor blood sugar closlely. nutrition counseling.  6. Sign and symptoms of hypoglycemia including shaking sweating,confusion and headaches.  This patient was seen by Vincent Gros FNP Collaboration with Dr Lyndon Code as a part of collaborative care agreement  Orders Placed This Encounter  Procedures  . POCT HgB A1C    Meds ordered this encounter  Medications  . metFORMIN (GLUCOPHAGE) 500 MG tablet    Sig: Take two tablets po QAM and two tablets po QPM    Dispense:  360 tablet    Refill:  1    Please note increased dose    Order Specific Question:   Supervising Provider    Answer:   Lyndon Code [[id number]]  . Semaglutide,0.25 or 0.5MG /DOS, (OZEMPIC, 0.25 OR 0.5 MG/DOSE,) 2 MG/1.5ML SOPN    Sig: Inject 0.375 mLs (0.5 mg total) into the skin once a week.    Dispense:  3 pen    Refill:  3    Changed to 90 day prescription.    Order Specific Question:   Supervising Provider    Answer:   Lyndon Code [[id number]]  . Tdap (BOOSTRIX) 5-2.5-18.5 LF-MCG/0.5 injection    Sig: Inject 0.5 mLs into the muscle once for 1 dose.    Dispense:  0.5 mL    Refill:  0    Order Specific Question:   Supervising Provider    Answer:   Lyndon Code [[id number]]    Total time spent: 30 Minutes   Time spent includes review of chart, medications, test results, and follow up plan with the patient.      Dr Lyndon Code Internal medicine

## 2019-12-27 ENCOUNTER — Other Ambulatory Visit
Admission: RE | Admit: 2019-12-27 | Discharge: 2019-12-27 | Disposition: A | Discharge status: Home / Self Care | Payer: 59 | Attending: Nurse Practitioner | Admitting: Nurse Practitioner

## 2019-12-27 DIAGNOSIS — I10 Essential (primary) hypertension: Secondary | ICD-10-CM | POA: Insufficient documentation

## 2019-12-27 DIAGNOSIS — E119 Type 2 diabetes mellitus without complications: Secondary | ICD-10-CM | POA: Diagnosis not present

## 2019-12-27 DIAGNOSIS — Z0000 Encounter for general adult medical examination without abnormal findings: Secondary | ICD-10-CM | POA: Diagnosis not present

## 2019-12-27 LAB — COMPREHENSIVE METABOLIC PANEL
ALT: 24 U/L (ref 0–44)
AST: 21 U/L (ref 15–41)
Albumin: 4.2 g/dL (ref 3.5–5.0)
Alkaline Phosphatase: 49 U/L (ref 38–126)
Anion gap: 5 (ref 5–15)
BUN: 14 mg/dL (ref 6–20)
CO2: 26 mmol/L (ref 22–32)
Calcium: 9 mg/dL (ref 8.9–10.3)
Chloride: 106 mmol/L (ref 98–111)
Creatinine, Ser: 0.57 mg/dL — ABNORMAL LOW (ref 0.61–1.24)
GFR calc Af Amer: >60 mL/min (ref >60)
GFR calc non Af Amer: >60 mL/min (ref >60)
Glucose, Bld: 127 mg/dL — ABNORMAL HIGH (ref 70–99)
Potassium: 4.1 mmol/L (ref 3.5–5.1)
Sodium: 137 mmol/L (ref 135–145)
Total Bilirubin: 0.7 mg/dL (ref 0.3–1.2)
Total Protein: 7.6 g/dL (ref 6.5–8.1)

## 2019-12-27 LAB — T4, FREE: Free T4: 1.03 ng/dL (ref 0.61–1.12)

## 2019-12-27 LAB — PSA: Prostatic Specific Antigen: 0.72 ng/mL (ref 0.00–4.00)

## 2019-12-27 LAB — LIPID PANEL
Cholesterol: 130 mg/dL (ref 0–200)
HDL: 42 mg/dL (ref >40)
LDL Cholesterol: 82 mg/dL (ref 0–99)
Total CHOL/HDL Ratio: 3.1 RATIO
Triglycerides: 29 mg/dL (ref <150)
VLDL: 6 mg/dL (ref 0–40)

## 2019-12-27 LAB — CBC
HCT: 47.2 % (ref 39.0–52.0)
Hemoglobin: 15 g/dL (ref 13.0–17.0)
MCH: 25.7 pg — ABNORMAL LOW (ref 26.0–34.0)
MCHC: 31.8 g/dL (ref 30.0–36.0)
MCV: 80.8 fL (ref 80.0–100.0)
Platelets: 287 10*3/uL (ref 150–400)
RBC: 5.84 MIL/uL — ABNORMAL HIGH (ref 4.22–5.81)
RDW: 13.4 % (ref 11.5–15.5)
WBC: 5.5 10*3/uL (ref 4.0–10.5)
nRBC: 0 % (ref 0.0–0.2)

## 2019-12-27 LAB — TSH: TSH: 0.848 u[IU]/mL (ref 0.350–4.500)

## 2020-03-06 ENCOUNTER — Encounter: Payer: Self-pay | Admitting: Nurse Practitioner

## 2020-03-06 ENCOUNTER — Ambulatory Visit (INDEPENDENT_AMBULATORY_CARE_PROVIDER_SITE_OTHER): Payer: 59 | Admitting: Nurse Practitioner

## 2020-03-06 ENCOUNTER — Other Ambulatory Visit: Payer: Self-pay

## 2020-03-06 VITALS — BP 142/80 | HR 85 | Temp 98.4°F | Resp 16 | Ht 65.0 in | Wt 182.6 lb

## 2020-03-06 DIAGNOSIS — E1165 Type 2 diabetes mellitus with hyperglycemia: Secondary | ICD-10-CM

## 2020-03-06 DIAGNOSIS — K219 Gastro-esophageal reflux disease without esophagitis: Secondary | ICD-10-CM

## 2020-03-06 DIAGNOSIS — Z0001 Encounter for general adult medical examination with abnormal findings: Secondary | ICD-10-CM | POA: Diagnosis not present

## 2020-03-06 LAB — POCT GLYCOSYLATED HEMOGLOBIN (HGB A1C): Hemoglobin A1C: 7.5 % — AB (ref 4.0–5.6)

## 2020-03-06 NOTE — Progress Notes (Signed)
[facility] [address] [place], [place] [address]  Internal MEDICINE  Office Visit Note  Patient Name: Austin Hardin  [id number]  [id number]  Date of Service: 03/06/2020  Chief Complaint  Patient presents with  . Annual Exam  . Diabetes  . Quality Metric Gaps    eye exam     The patient is here for health maintenance exam. Blood sugars are a little higher at today's visit than at prior check. HgbA1c is 7.5, up from 7.0 at his recent check. He admits to eating more carbohydrates and sugar these last few months. He continues to take all diabetic medication as prescribed. Blood pressure is well managed with diet control. He does not wish to take medication for blood pressure at this time. He also dose not wish to take statin medication as lipid panel done 12/2019 is within normal limits. He is due to have diabetic eye exam. Will call provider to schedule appointment. He has no new concerns or complaints today. States that he feels well.   Pt is here for routine health maintenance examination  Current Medication: Outpatient Encounter Medications as of 03/06/2020  Medication Sig  . ACCU-CHEK GUIDE test strip USE TO CHECK BS TWICE A DAY  . aspirin 81 MG tablet Take 81 mg by mouth daily.  Marland Kitchen FARXIGA 10 MG TABS tablet TAKE 1 TABLET BY MOUTH ONCE DAILY  . IRON PO Take 65 mg by mouth.  . metFORMIN (GLUCOPHAGE) 500 MG tablet Take two tablets po QAM and two tablets po QPM  . nystatin ointment (MYCOSTATIN) Apply 1 application topically 2 (two) times daily.  Marland Kitchen omeprazole (PRILOSEC) 40 MG capsule Take 1 capsule (40 mg total) by mouth as needed. Reported on 11/27/2015  . Semaglutide,0.25 or 0.5MG /DOS, (OZEMPIC, 0.25 OR 0.5 MG/DOSE,) 2 MG/1.5ML SOPN Inject 0.375 mLs (0.5 mg total) into the skin once a week.  Marland Kitchen UNIFINE PENTIPS 32G X 4 MM MISC FOR USE WITH ONCE DAILY VICTOZA   No facility-administered encounter medications on file as of 03/06/2020.    Surgical History: Past  Surgical History:  Procedure Laterality Date  . HERNIA REPAIR  1991    Medical History: Past Medical History:  Diagnosis Date  . Allergy    environmental  . Diabetes mellitus without complication (HCC)     Family History: Family History  Problem Relation Age of Onset  . Diabetes Mother   . Hyperlipidemia Mother   . Hypertension Mother   . Cancer Father   . Diabetes Father   . Hyperlipidemia Father   . Hypertension Father   . Heart disease Father   . Diabetes Brother   . Diabetes Brother       Review of Systems  Constitutional: Negative for activity change, chills, fatigue and unexpected weight change.  HENT: Negative for congestion, postnasal drip, rhinorrhea, sneezing and sore throat.   Respiratory: Negative for cough, chest tightness, shortness of breath and wheezing.   Cardiovascular: Negative for chest pain and palpitations.  Gastrointestinal: Negative for abdominal pain, constipation, diarrhea, nausea and vomiting.  Endocrine: Negative for cold intolerance, heat intolerance, polydipsia and polyuria.       Blood sugars elevated some since his last visit   Genitourinary: Negative for dysuria, frequency and urgency.  Musculoskeletal: Negative for arthralgias, back pain, joint swelling and neck pain.  Skin: Negative for rash.  Allergic/Immunologic: Negative for environmental allergies.  Neurological: Negative for dizziness, tremors, numbness and headaches.  Hematological: Negative for adenopathy. Does not bruise/bleed easily.  Psychiatric/Behavioral: Negative  for behavioral problems (Depression), sleep disturbance and suicidal ideas. The patient is not nervous/anxious.     Today's Vitals   03/06/20 0925  BP: (!) 142/80  Pulse: 85  Resp: 16  Temp: 98.4 F (36.9 C)  SpO2: 98%  Weight: 182 lb 9.6 oz (82.8 kg)  Height: 5\' 5"  (1.651 m)   Body mass index is 30.39 kg/m.  Physical Exam Vitals and nursing note reviewed.  Constitutional:      General: He is not  in acute distress.    Appearance: Normal appearance. He is well-developed. He is not diaphoretic.  HENT:     Head: Normocephalic and atraumatic.     Nose: Nose normal.     Mouth/Throat:     Pharynx: No oropharyngeal exudate.  Eyes:     Pupils: Pupils are equal, round, and reactive to light.  Neck:     Thyroid: No thyromegaly.     Vascular: No carotid bruit or JVD.     Trachea: No tracheal deviation.  Cardiovascular:     Rate and Rhythm: Normal rate and regular rhythm.     Pulses: Normal pulses.          Dorsalis pedis pulses are 2+ on the right side and 2+ on the left side.       Posterior tibial pulses are 2+ on the right side and 2+ on the left side.     Heart sounds: Normal heart sounds. No murmur heard.  No friction rub. No gallop.   Pulmonary:     Effort: Pulmonary effort is normal. No respiratory distress.     Breath sounds: Normal breath sounds. No wheezing or rales.  Chest:     Chest wall: No tenderness.  Abdominal:     General: Bowel sounds are normal.     Palpations: Abdomen is soft.     Tenderness: There is no abdominal tenderness.  Musculoskeletal:        General: Normal range of motion.     Cervical back: Normal range of motion and neck supple.     Right foot: Normal range of motion. No deformity or bunion.     Left foot: Normal range of motion. No deformity or bunion.  Feet:     Right foot:     Protective Sensation: 10 sites tested. 10 sites sensed.     Skin integrity: Skin integrity normal.     Toenail Condition: Right toenails are normal.     Left foot:     Protective Sensation: 10 sites tested. 10 sites sensed.     Skin integrity: Skin integrity normal.     Toenail Condition: Left toenails are normal.  Lymphadenopathy:     Cervical: No cervical adenopathy.  Skin:    General: Skin is warm and dry.  Neurological:     General: No focal deficit present.     Mental Status: He is alert and oriented to person, place, and time.     Cranial Nerves: No cranial  nerve deficit.  Psychiatric:        Mood and Affect: Mood normal.        Behavior: Behavior normal.        Thought Content: Thought content normal.        Judgment: Judgment normal.    LABS: Recent Results (from the past 2160 hour(s))  POCT HgB A1C     Status: Abnormal   Collection Time: 12/20/19 10:00 AM  Result Value Ref Range   Hemoglobin A1C 7.0 (A) 4.0 -  5.6 %   HbA1c POC (<> result, manual entry)     HbA1c, POC (prediabetic range)     HbA1c, POC (controlled diabetic range)    CBC     Status: Abnormal   Collection Time: 12/27/19 10:47 AM  Result Value Ref Range   WBC 5.5 4.0 - 10.5 K/uL   RBC 5.84 (H) 4.22 - 5.81 MIL/uL   Hemoglobin 15.0 13.0 - 17.0 g/dL   HCT 83.4 39 - 52 %   MCV 80.8 80.0 - 100.0 fL   MCH 25.7 (L) 26.0 - 34.0 pg   MCHC 31.8 30.0 - 36.0 g/dL   RDW 19.6 22.2 - 97.9 %   Platelets 287 150 - 400 K/uL   nRBC 0.0 0.0 - 0.2 %    Comment: Performed at [facility], [address] [place], [place] [address]  Comprehensive metabolic panel     Status: Abnormal   Collection Time: 12/27/19 10:47 AM  Result Value Ref Range   Sodium 137 135 - 145 mmol/L   Potassium 4.1 3.5 - 5.1 mmol/L   Chloride 106 98 - 111 mmol/L   CO2 26 22 - 32 mmol/L   Glucose, Bld 127 (H) 70 - 99 mg/dL    Comment: Glucose reference range applies only to samples taken after fasting for at least 8 hours.   BUN 14 6 - 20 mg/dL   Creatinine, Ser 9.41 (L) 0.61 - 1.24 mg/dL   Calcium 9.0 8.9 - 74.0 mg/dL   Total Protein 7.6 6.5 - 8.1 g/dL   Albumin 4.2 3.5 - 5.0 g/dL   AST 21 15 - 41 U/L   ALT 24 0 - 44 U/L   Alkaline Phosphatase 49 38 - 126 U/L   Total Bilirubin 0.7 0.3 - 1.2 mg/dL   GFR calc non Af Amer >60 >60 mL/min   GFR calc Af Amer >60 >60 mL/min   Anion gap 5 5 - 15    Comment: Performed at [facility], [address] [place], [place] [address]  Lipid panel     Status: None   Collection Time: 12/27/19 10:47 AM  Result Value Ref Range   Cholesterol 130 0 -  200 mg/dL   Triglycerides 29 <185 mg/dL   HDL 42 >63 mg/dL   Total CHOL/HDL Ratio 3.1 RATIO   VLDL 6 0 - 40 mg/dL   LDL Cholesterol 82 0 - 99 mg/dL    Comment:        Total Cholesterol/HDL:CHD Risk Coronary Heart Disease Risk Table                     Men   Women  1/2 Average Risk   3.4   3.3  Average Risk       5.0   4.4  2 X Average Risk   9.6   7.1  3 X Average Risk  23.4   11.0        Use the calculated Patient Ratio above and the CHD Risk Table to determine the patient's CHD Risk.        ATP III CLASSIFICATION (LDL):  <100     mg/dL   Optimal  149-702  mg/dL   Near or Above                    Optimal  130-159  mg/dL   Borderline  637-858  mg/dL   High  >850     mg/dL  Very High Performed at [facility], [address] [place], [place] [address]   TSH     Status: None   Collection Time: 12/27/19 10:47 AM  Result Value Ref Range   TSH 0.848 0.350 - 4.500 uIU/mL    Comment: Performed by a 3rd Generation assay with a functional sensitivity of <=0.01 uIU/mL. Performed at [facility], [address] [place], [place] [address]   T4, free     Status: None   Collection Time: 12/27/19 10:47 AM  Result Value Ref Range   Free T4 1.03 0.61 - 1.12 ng/dL    Comment: (NOTE) Biotin ingestion may interfere with free T4 tests. If the results are inconsistent with the TSH level, previous test results, or the clinical presentation, then consider biotin interference. If needed, order repeat testing after stopping biotin. Performed at [facility], [address] [place], [place] [address]   PSA     Status: None   Collection Time: 12/27/19 10:47 AM  Result Value Ref Range   Prostatic Specific Antigen 0.72 0.00 - 4.00 ng/mL    Comment: (NOTE) While PSA levels of <=4.0 ng/ml are reported as reference range, some men with levels below 4.0 ng/ml can have prostate cancer and many men with PSA above 4.0 ng/ml do not have prostate cancer.  Other  tests such as free PSA, age specific reference ranges, PSA velocity and PSA doubling time may be helpful especially in men less than 40 years old. Performed at [facility], [address] [place], [place] [address]   POCT HgB A1C     Status: Abnormal   Collection Time: 03/06/20  9:41 AM  Result Value Ref Range   Hemoglobin A1C 7.5 (A) 4.0 - 5.6 %   HbA1c POC (<> result, manual entry)     HbA1c, POC (prediabetic range)     HbA1c, POC (controlled diabetic range)      Assessment/Plan: 1. Encounter for general adult medical examination with abnormal findings Annual health maintenance exam today.   2. Type 2 diabetes mellitus with hyperglycemia, without long-term current use of insulin (HCC) - POCT HgB A1C 7.5 today. Continue diabetic medication as prescribed. Advised him to limit intake of carbohydrates and sugar and to exercise routinely. Refer for diabetic eye exam.  - Ambulatory referral to Ophthalmology  3. Gastroesophageal reflux disease without esophagitis Use omeprazole as needed and as prescribed   General Counseling: Lexie verbalizes understanding of the findings of todays visit and agrees with plan of treatment. I have discussed any further diagnostic evaluation that may be needed or ordered today. We also reviewed his medications today. he has been encouraged to call the office with any questions or concerns that should arise related to todays visit.    Counseling:  Diabetes Counseling:  1. Addition of ACE inh/ ARB'S for nephroprotection. Microalbumin is updated  2. Diabetic foot care, prevention of complications. Podiatry consult 3. Exercise and lose weight.  4. Diabetic eye examination, Diabetic eye exam is updated  5. Monitor blood sugar closlely. nutrition counseling.  6. Sign and symptoms of hypoglycemia including shaking sweating,confusion and headaches.  This patient was seen by Vincent Gros FNP Collaboration with Dr Lyndon Code as a part of  collaborative care agreement  Orders Placed This Encounter  Procedures  . Ambulatory referral to Ophthalmology  . POCT HgB A1C     Total time spent: 30 Minutes  Time spent includes review of chart, medications, test results, and follow up  plan with the patient.     Lavera Guise, MD  Internal Medicine

## 2020-05-17 ENCOUNTER — Other Ambulatory Visit: Payer: Self-pay

## 2020-05-17 ENCOUNTER — Other Ambulatory Visit: Payer: Self-pay | Admitting: Internal Medicine

## 2020-05-17 DIAGNOSIS — K219 Gastro-esophageal reflux disease without esophagitis: Secondary | ICD-10-CM

## 2020-05-17 MED ORDER — OMEPRAZOLE 40 MG PO CPDR: 40.0000 mg | DELAYED_RELEASE_CAPSULE | ORAL | 5 refills | Status: DC | PRN

## 2020-05-23 DIAGNOSIS — H52223 Regular astigmatism, bilateral: Secondary | ICD-10-CM | POA: Diagnosis not present

## 2020-05-23 DIAGNOSIS — H5213 Myopia, bilateral: Secondary | ICD-10-CM | POA: Diagnosis not present

## 2020-05-23 DIAGNOSIS — H524 Presbyopia: Secondary | ICD-10-CM | POA: Diagnosis not present

## 2020-05-25 ENCOUNTER — Other Ambulatory Visit: Payer: Self-pay | Admitting: Nurse Practitioner

## 2020-05-25 ENCOUNTER — Other Ambulatory Visit: Payer: Self-pay

## 2020-05-25 MED ORDER — FREESTYLE SYSTEM KIT: 1.0000 | PACK | Freq: Two times a day (BID) | 0 refills | Status: AC

## 2020-05-25 MED ORDER — FREESTYLE LANCETS MISC: 1.0000 | 1 refills | Status: DC | PRN

## 2020-05-25 MED ORDER — FREESTYLE TEST VI STRP: ORAL_STRIP | 1 refills | Status: DC

## 2020-06-13 ENCOUNTER — Ambulatory Visit: Payer: 59 | Admitting: Nurse Practitioner

## 2020-07-24 ENCOUNTER — Other Ambulatory Visit: Payer: Self-pay

## 2020-07-24 ENCOUNTER — Other Ambulatory Visit: Payer: Self-pay | Admitting: Internal Medicine

## 2020-07-24 DIAGNOSIS — E1165 Type 2 diabetes mellitus with hyperglycemia: Secondary | ICD-10-CM

## 2020-07-24 MED ORDER — DAPAGLIFLOZIN PROPANEDIOL 10 MG PO TABS: 10.0000 mg | ORAL_TABLET | Freq: Every day | ORAL | 1 refills | Status: DC

## 2020-07-28 ENCOUNTER — Other Ambulatory Visit: Payer: Self-pay | Admitting: Hospice and Palliative Medicine

## 2020-07-28 ENCOUNTER — Other Ambulatory Visit: Payer: Self-pay

## 2020-07-28 ENCOUNTER — Encounter: Payer: Self-pay | Admitting: Hospice and Palliative Medicine

## 2020-07-28 ENCOUNTER — Ambulatory Visit: Payer: 59 | Admitting: Hospice and Palliative Medicine

## 2020-07-28 VITALS — BP 132/84 | HR 70 | Temp 97.6°F | Resp 16 | Ht 65.0 in | Wt 186.0 lb

## 2020-07-28 DIAGNOSIS — E1165 Type 2 diabetes mellitus with hyperglycemia: Secondary | ICD-10-CM

## 2020-07-28 DIAGNOSIS — Z6830 Body mass index (BMI) 30.0-30.9, adult: Secondary | ICD-10-CM | POA: Diagnosis not present

## 2020-07-28 DIAGNOSIS — E6609 Other obesity due to excess calories: Secondary | ICD-10-CM

## 2020-07-28 LAB — POCT GLYCOSYLATED HEMOGLOBIN (HGB A1C): Hemoglobin A1C: 7.9 % — AB (ref 4.0–5.6)

## 2020-07-28 MED ORDER — LIRAGLUTIDE 18 MG/3ML ~~LOC~~ SOPN: 1.8000 mg | PEN_INJECTOR | Freq: Every day | SUBCUTANEOUS | 3 refills | Status: DC

## 2020-07-28 NOTE — Progress Notes (Signed)
[facility] [place], [place] [address]  Internal MEDICINE  Office Visit Note  Patient Name: Austin Hardin  [id number]  [id number]  Date of Service: 07/28/2020  Chief Complaint  Patient presents with  . Follow-up  . Diabetes    HPI Patient is here for routine follow-up Overall things have been going well No acute issues or complaints today Has noticed his glucose levels have been more elevated, prescribed to take Victoza daily, Metformin BID and Wilder Glade He has not been taking Victoza but wanting to restart due to recently elevated glucose levels  Colonoscopy up to date  Current Medication: Outpatient Encounter Medications as of 07/28/2020  Medication Sig  . liraglutide (VICTOZA) 18 MG/3ML SOPN Inject 1.8 mg into the skin daily.  Marland Kitchen aspirin 81 MG tablet Take 81 mg by mouth daily.  . dapagliflozin propanediol (FARXIGA) 10 MG TABS tablet Take 1 tablet (10 mg total) by mouth daily.  Marland Kitchen glucose blood (FREESTYLE TEST STRIPS) test strip Use as instructed twice a daily DXE11.65  . glucose monitoring kit (FREESTYLE) monitoring kit 1 each by Does not apply route in the morning and at bedtime. Dx e11.65  . IRON PO Take 65 mg by mouth.  . Lancets (FREESTYLE) lancets 1 each by Other route as needed for other. Use as instructed twice daily DX e11.65  . metFORMIN (GLUCOPHAGE) 500 MG tablet Take two tablets po QAM and two tablets po QPM  . nystatin ointment (MYCOSTATIN) Apply 1 application topically 2 (two) times daily.  Marland Kitchen omeprazole (PRILOSEC) 40 MG capsule Take 1 capsule (40 mg total) by mouth as needed. Reported on 11/27/2015  . UNIFINE PENTIPS 32G X 4 MM MISC FOR USE WITH ONCE DAILY VICTOZA  . [DISCONTINUED] Semaglutide,0.25 or 0.5MG/DOS, (OZEMPIC, 0.25 OR 0.5 MG/DOSE,) 2 MG/1.5ML SOPN Inject 0.375 mLs (0.5 mg total) into the skin once a week.   No facility-administered encounter medications on file as of 07/28/2020.    Surgical History: Past Surgical History:   Procedure Laterality Date  . HERNIA REPAIR  1991    Medical History: Past Medical History:  Diagnosis Date  . Allergy    environmental  . Diabetes mellitus without complication ([place])     Family History: Family History  Problem Relation Age of Onset  . Diabetes Mother   . Hyperlipidemia Mother   . Hypertension Mother   . Cancer Father   . Diabetes Father   . Hyperlipidemia Father   . Hypertension Father   . Heart disease Father   . Diabetes Brother   . Diabetes Brother     Social History   Socioeconomic History  . Marital status: Married    Spouse name: Not on file  . Number of children: Not on file  . Years of education: Not on file  . Highest education level: Not on file  Occupational History  . Not on file  Tobacco Use  . Smoking status: Never Smoker  . Smokeless tobacco: Never Used  Vaping Use  . Vaping Use: Never used  Substance and Sexual Activity  . Alcohol use: No    Alcohol/week: 0.0 standard drinks  . Drug use: No  . Sexual activity: Not on file  Other Topics Concern  . Not on file  Social History Narrative  . Not on file   Social Determinants of Health   Financial Resource Strain: Not on file  Food Insecurity: Not on file  Transportation Needs: Not on file  Physical Activity: Not on file  Stress:  Not on file  Social Connections: Not on file  Intimate Partner Violence: Not on file      Review of Systems  Constitutional: Negative for chills, fatigue and unexpected weight change.  HENT: Negative for congestion, postnasal drip, rhinorrhea, sneezing and sore throat.   Eyes: Negative for redness.  Respiratory: Negative for cough, chest tightness and shortness of breath.   Cardiovascular: Negative for chest pain and palpitations.  Gastrointestinal: Negative for abdominal pain, constipation, diarrhea, nausea and vomiting.  Genitourinary: Negative for dysuria and frequency.  Musculoskeletal: Negative for arthralgias, back pain, joint  swelling and neck pain.  Skin: Negative for rash.  Neurological: Negative for tremors and numbness.  Hematological: Negative for adenopathy. Does not bruise/bleed easily.  Psychiatric/Behavioral: Negative for behavioral problems (Depression), sleep disturbance and suicidal ideas. The patient is not nervous/anxious.     Vital Signs: BP 132/84   Pulse 70   Temp 97.6 F (36.4 C)   Resp 16   Ht '5\' 5"'  (1.651 m)   Wt 186 lb (84.4 kg)   SpO2 98%   BMI 30.95 kg/m    Physical Exam Vitals reviewed.  Constitutional:      Appearance: Normal appearance. He is obese.  Cardiovascular:     Rate and Rhythm: Normal rate and regular rhythm.     Pulses: Normal pulses.     Heart sounds: Normal heart sounds.  Pulmonary:     Effort: Pulmonary effort is normal.     Breath sounds: Normal breath sounds.  Abdominal:     General: Abdomen is flat.     Palpations: Abdomen is soft.  Musculoskeletal:        General: Normal range of motion.     Cervical back: Normal range of motion.  Skin:    General: Skin is warm.  Neurological:     General: No focal deficit present.     Mental Status: He is alert and oriented to person, place, and time. Mental status is at baseline.  Psychiatric:        Mood and Affect: Mood normal.        Behavior: Behavior normal.        Thought Content: Thought content normal.        Judgment: Judgment normal.    Assessment/Plan: 1. Type 2 diabetes mellitus with hyperglycemia, without long-term current use of insulin (HCC) A1C 7.9--uncontrolled, restart Victoza daily Discussed importance of medication adherence Discussed healthy lifestyle modifications to help with controlling glucose levels, diet changes and increasing physical activity - POCT HgB A1C - liraglutide (VICTOZA) 18 MG/3ML SOPN; Inject 1.8 mg into the skin daily.  Dispense: 3 mL; Refill: 3  2. Class 1 obesity due to excess calories with serious comorbidity and body mass index (BMI) of 30.0 to 30.9 in  adult Comorbid condition--T2DM, uncontrolled Obesity Counseling: Risk Assessment: An assessment of behavioral risk factors was made today and includes lack of exercise sedentary lifestyle, lack of portion control and poor dietary habits.  Risk Modification Advice: She was counseled on portion control guidelines. Restricting daily caloric intake to 1800. The detrimental long term effects of obesity on her health and ongoing poor compliance was also discussed with the patient.  General Counseling: Torris verbalizes understanding of the findings of todays visit and agrees with plan of treatment. I have discussed any further diagnostic evaluation that may be needed or ordered today. We also reviewed her medications today. she has been encouraged to call the office with any questions or concerns that should arise related  to todays visit.    Orders Placed This Encounter  Procedures  . POCT HgB A1C    Meds ordered this encounter  Medications  . liraglutide (VICTOZA) 18 MG/3ML SOPN    Sig: Inject 1.8 mg into the skin daily.    Dispense:  3 mL    Refill:  3    Time spent: 30 Minutes Time spent includes review of chart, medications, test results and follow-up plan with the patient.  This patient was seen by Theodoro Grist AGNP-C in Collaboration with Dr Lavera Guise as a part of collaborative care agreement     Tanna Furry. Kareen Jefferys AGNP-C Internal medicine

## 2020-07-30 ENCOUNTER — Encounter: Payer: Self-pay | Admitting: Hospice and Palliative Medicine

## 2020-08-18 ENCOUNTER — Other Ambulatory Visit: Payer: Self-pay

## 2020-08-18 ENCOUNTER — Telehealth: Payer: Self-pay

## 2020-08-18 ENCOUNTER — Other Ambulatory Visit: Payer: Self-pay | Admitting: Nurse Practitioner

## 2020-08-18 ENCOUNTER — Other Ambulatory Visit: Payer: Self-pay | Admitting: Hospice and Palliative Medicine

## 2020-08-18 DIAGNOSIS — E1165 Type 2 diabetes mellitus with hyperglycemia: Secondary | ICD-10-CM

## 2020-08-18 MED ORDER — AZELASTINE HCL 0.1 % NA SOLN: 2.0000 | Freq: Two times a day (BID) | NASAL | 0 refills | Status: DC

## 2020-08-18 MED ORDER — AZITHROMYCIN 250 MG PO TABS: 250.0000 mg | ORAL_TABLET | Freq: Every day | ORAL | 0 refills | Status: DC

## 2020-08-18 MED ORDER — METFORMIN HCL 500 MG PO TABS: ORAL_TABLET | ORAL | 1 refills | Status: DC

## 2020-08-18 NOTE — Telephone Encounter (Signed)
Pt called having sinus infection covid test was negative unable to come in as per taylor send zpak and nasal spray not feeling better need to be seen in person

## 2020-11-13 ENCOUNTER — Other Ambulatory Visit: Payer: Self-pay

## 2020-11-13 MED FILL — Liraglutide Soln Pen-injector 18 MG/3ML (6 MG/ML): SUBCUTANEOUS | 30 days supply | Qty: 9 | Fill #0 | Status: AC

## 2020-11-13 MED FILL — Metformin HCl Tab 500 MG: ORAL | 90 days supply | Qty: 360 | Fill #0 | Status: AC

## 2020-11-13 MED FILL — Dapagliflozin Propanediol Tab 10 MG (Base Equivalent): ORAL | 90 days supply | Qty: 90 | Fill #0 | Status: AC

## 2020-12-15 ENCOUNTER — Other Ambulatory Visit (HOSPITAL_COMMUNITY): Payer: Self-pay

## 2021-01-05 ENCOUNTER — Other Ambulatory Visit: Payer: Self-pay

## 2021-01-05 MED ORDER — UNIFINE PENTIPS 32G X 4 MM MISC
5 refills | Status: DC
  Filled 2021-01-05: qty 100, 90d supply, fill #0
  Filled 2021-04-05: qty 100, 90d supply, fill #1

## 2021-01-05 MED FILL — Liraglutide Soln Pen-injector 18 MG/3ML (6 MG/ML): SUBCUTANEOUS | 60 days supply | Qty: 18 | Fill #1 | Status: AC

## 2021-02-13 ENCOUNTER — Other Ambulatory Visit: Payer: Self-pay

## 2021-02-13 ENCOUNTER — Other Ambulatory Visit: Payer: Self-pay | Admitting: Internal Medicine

## 2021-02-13 MED ORDER — DAPAGLIFLOZIN PROPANEDIOL 10 MG PO TABS
ORAL_TABLET | Freq: Every day | ORAL | 1 refills | Status: DC
  Filled 2021-02-13: qty 90, 90d supply, fill #0

## 2021-02-14 ENCOUNTER — Other Ambulatory Visit: Payer: Self-pay

## 2021-02-14 DIAGNOSIS — E1165 Type 2 diabetes mellitus with hyperglycemia: Secondary | ICD-10-CM

## 2021-02-14 MED ORDER — METFORMIN HCL 500 MG PO TABS
ORAL_TABLET | ORAL | 1 refills | Status: DC
  Filled 2021-02-14: qty 360, 90d supply, fill #0

## 2021-02-20 ENCOUNTER — Other Ambulatory Visit: Payer: Self-pay

## 2021-03-08 ENCOUNTER — Encounter: Payer: 59 | Admitting: Hospice and Palliative Medicine

## 2021-03-09 ENCOUNTER — Other Ambulatory Visit: Payer: Self-pay

## 2021-03-09 ENCOUNTER — Ambulatory Visit (INDEPENDENT_AMBULATORY_CARE_PROVIDER_SITE_OTHER): Payer: 59 | Admitting: Nurse Practitioner

## 2021-03-09 ENCOUNTER — Encounter: Payer: Self-pay | Admitting: Nurse Practitioner

## 2021-03-09 VITALS — BP 154/82 | HR 88 | Temp 98.6°F | Resp 16 | Ht 66.0 in | Wt 189.6 lb

## 2021-03-09 DIAGNOSIS — Z6830 Body mass index (BMI) 30.0-30.9, adult: Secondary | ICD-10-CM | POA: Diagnosis not present

## 2021-03-09 DIAGNOSIS — K219 Gastro-esophageal reflux disease without esophagitis: Secondary | ICD-10-CM | POA: Diagnosis not present

## 2021-03-09 DIAGNOSIS — E6609 Other obesity due to excess calories: Secondary | ICD-10-CM | POA: Diagnosis not present

## 2021-03-09 DIAGNOSIS — Z0001 Encounter for general adult medical examination with abnormal findings: Secondary | ICD-10-CM

## 2021-03-09 DIAGNOSIS — R300 Dysuria: Secondary | ICD-10-CM

## 2021-03-09 DIAGNOSIS — E559 Vitamin D deficiency, unspecified: Secondary | ICD-10-CM | POA: Diagnosis not present

## 2021-03-09 DIAGNOSIS — E1165 Type 2 diabetes mellitus with hyperglycemia: Secondary | ICD-10-CM | POA: Diagnosis not present

## 2021-03-09 DIAGNOSIS — E7849 Other hyperlipidemia: Secondary | ICD-10-CM

## 2021-03-09 LAB — POCT GLYCOSYLATED HEMOGLOBIN (HGB A1C): Hemoglobin A1C: 8.1 % — AB (ref 4.0–5.6)

## 2021-03-09 MED ORDER — PANTOPRAZOLE SODIUM 40 MG PO TBEC
40.0000 mg | DELAYED_RELEASE_TABLET | Freq: Every day | ORAL | 2 refills | Status: DC
  Filled 2021-03-09: qty 30, 30d supply, fill #0
  Filled 2021-11-27: qty 30, 30d supply, fill #1

## 2021-03-09 MED ORDER — LIRAGLUTIDE 18 MG/3ML ~~LOC~~ SOPN
1.8000 mg | PEN_INJECTOR | Freq: Every day | SUBCUTANEOUS | 3 refills | Status: DC
  Filled 2021-03-09: qty 9, 30d supply, fill #0
  Filled 2021-04-05: qty 9, 30d supply, fill #1

## 2021-03-09 NOTE — Progress Notes (Signed)
[facility] [place], [place] [address]  Internal MEDICINE  Office Visit Note  Patient Name: Austin Hardin  [id number]  [id number]  Date of Service: 03/25/2021  Chief Complaint  Patient presents with   Annual Exam    refills   Diabetes    HPI Austin Hardin presents for an annual well visit and physical exam. he has a history of GERD and diabetes. His surgical history is significant for hernia repair in 1991. He received 3 doses of the covid vaccine. His blood pressure is elevated today but he is not diagnosed with hypertension or on any medications for it. He takes metformin, farxiga and victoza for diabetes. He has his screening colonoscopy in 2015. He is due for diabetic foot exam today. He is due for his diabetic eye exam and has an appointment with his eye doctor next week. He denies any pain. He is a nonsmoker, denies alcohol use and denies recreational drug use. He has no other concerns or questions today.      Current Medication: Outpatient Encounter Medications as of 03/09/2021  Medication Sig   aspirin 81 MG tablet Take 81 mg by mouth daily.   azelastine (ASTELIN) 0.1 % nasal spray PLACE 2 SPRAYS INTO BOTH NOSTRILS TWO TIMES DAILY AS DIRECTED   Blood Glucose Monitoring Suppl (FREESTYLE FREEDOM LITE) w/Device KIT USE AS DIRECTED TO CHECK BLOOD SUGAR EVERY MORNING AND EVERY NIGHT AT BEDTIME   dapagliflozin propanediol (FARXIGA) 10 MG TABS tablet TAKE 1 TABLET (10 MG TOTAL) BY MOUTH DAILY.   glucose blood test strip USE AS INSTRUCTED TWICE A DAILY   glucose monitoring kit (FREESTYLE) monitoring kit 1 each by Does not apply route in the morning and at bedtime. Dx e11.65   Insulin Pen Needle (UNIFINE PENTIPS) 32G X 4 MM MISC FOR USE WITH ONCE DAILY VICTOZA   IRON PO Take 65 mg by mouth.   Lancets (FREESTYLE) lancets USE AS DIRECTED TO CHECK BLOOD SUGAR EVERY MORNING AND EVERY NIGHT AT BEDTIME   metFORMIN (GLUCOPHAGE) 500 MG tablet TAKE TWO TABLETS BY  MOUTH EVERY MORNING AND TWO TABLETS BY MOUTH EVERY EVENING   nystatin ointment (MYCOSTATIN) Apply 1 application topically 2 (two) times daily.   omeprazole (PRILOSEC) 40 MG capsule TAKE 1 CAPSULE BY MOUTH AS NEEDED   pantoprazole (PROTONIX) 40 MG tablet Take 1 tablet (40 mg total) by mouth daily.   [DISCONTINUED] azithromycin (ZITHROMAX) 250 MG tablet TAKE 1 TABLET BY MOUTH DAILY FOR 10 DAYS   [DISCONTINUED] liraglutide (VICTOZA) 18 MG/3ML SOPN INJECT 1.8 MG INTO THE SKIN DAILY.   liraglutide (VICTOZA) 18 MG/3ML SOPN INJECT 1.8 MG INTO THE SKIN DAILY.   No facility-administered encounter medications on file as of 03/09/2021.    Surgical History: Past Surgical History:  Procedure Laterality Date   HERNIA REPAIR  1991    Medical History: Past Medical History:  Diagnosis Date   Allergy    environmental   Diabetes mellitus without complication ([place])     Family History: Family History  Problem Relation Age of Onset   Diabetes Mother    Hyperlipidemia Mother    Hypertension Mother    Cancer Father    Diabetes Father    Hyperlipidemia Father    Hypertension Father    Heart disease Father    Diabetes Brother    Diabetes Brother     Social History   Socioeconomic History   Marital status: Married    Spouse name: Not on file   Number of  children: Not on file   Years of education: Not on file   Highest education level: Not on file  Occupational History   Not on file  Tobacco Use   Smoking status: Never   Smokeless tobacco: Never  Vaping Use   Vaping Use: Never used  Substance and Sexual Activity   Alcohol use: No    Alcohol/week: 0.0 standard drinks   Drug use: No   Sexual activity: Not on file  Other Topics Concern   Not on file  Social History Narrative   Not on file   Social Determinants of Health   Financial Resource Strain: Not on file  Food Insecurity: Not on file  Transportation Needs: Not on file  Physical Activity: Not on file  Stress: Not on file   Social Connections: Not on file  Intimate Partner Violence: Not on file      Review of Systems  Constitutional:  Negative for activity change, appetite change, chills, fatigue, fever and unexpected weight change.  HENT: Negative.  Negative for congestion, ear pain, rhinorrhea, sore throat and trouble swallowing.   Eyes: Negative.   Respiratory: Negative.  Negative for cough, chest tightness, shortness of breath and wheezing.   Cardiovascular: Negative.  Negative for chest pain.  Gastrointestinal: Negative.  Negative for abdominal pain, blood in stool, constipation, diarrhea, nausea and vomiting.  Endocrine: Negative.   Genitourinary: Negative.  Negative for difficulty urinating, dysuria, frequency, hematuria and urgency.  Musculoskeletal: Negative.  Negative for arthralgias, back pain, joint swelling, myalgias and neck pain.  Skin: Negative.  Negative for rash and wound.  Allergic/Immunologic: Negative.  Negative for immunocompromised state.  Neurological: Negative.  Negative for dizziness, seizures, numbness and headaches.  Hematological: Negative.   Psychiatric/Behavioral: Negative.  Negative for behavioral problems, self-injury and suicidal ideas. The patient is not nervous/anxious.    Vital Signs: BP (!) 154/82   Pulse 88   Temp 98.6 F (37 C)   Resp 16   Ht 5' 6" (1.676 m)   Wt 189 lb 9.6 oz (86 kg)   SpO2 98%   BMI 30.60 kg/m    Physical Exam Vitals reviewed.  Constitutional:      General: He is awake. He is not in acute distress.    Appearance: Normal appearance. He is well-developed and well-groomed. He is obese. He is not ill-appearing or diaphoretic.  HENT:     Head: Normocephalic and atraumatic.     Right Ear: Tympanic membrane, ear canal and external ear normal.     Left Ear: Tympanic membrane, ear canal and external ear normal.     Nose: Nose normal. No congestion or rhinorrhea.     Mouth/Throat:     Lips: Pink.     Mouth: Mucous membranes are moist.      Pharynx: Oropharynx is clear. Uvula midline. No oropharyngeal exudate or posterior oropharyngeal erythema.  Eyes:     General: Lids are normal. Vision grossly intact. Gaze aligned appropriately. No scleral icterus.       Right eye: No discharge.        Left eye: No discharge.     Extraocular Movements: Extraocular movements intact.     Conjunctiva/sclera: Conjunctivae normal.     Pupils: Pupils are equal, round, and reactive to light.     Funduscopic exam:    Right eye: Red reflex present.        Left eye: Red reflex present. Neck:     Thyroid: No thyromegaly.     Vascular: No  JVD.     Trachea: No tracheal deviation.  Cardiovascular:     Rate and Rhythm: Normal rate and regular rhythm.     Pulses: Normal pulses.          Dorsalis pedis pulses are 2+ on the right side and 2+ on the left side.       Posterior tibial pulses are 2+ on the right side and 2+ on the left side.     Heart sounds: Normal heart sounds, S1 normal and S2 normal. No murmur heard.   No friction rub. No gallop.  Pulmonary:     Effort: Pulmonary effort is normal. No accessory muscle usage or respiratory distress.     Breath sounds: Normal breath sounds and air entry. No stridor. No wheezing or rales.  Chest:     Chest wall: No tenderness.  Abdominal:     General: Bowel sounds are normal. There is no distension.     Palpations: Abdomen is soft. There is no shifting dullness, fluid wave, mass or pulsatile mass.     Tenderness: There is no abdominal tenderness. There is no guarding or rebound.  Musculoskeletal:        General: No tenderness or deformity. Normal range of motion.     Cervical back: Normal range of motion and neck supple.     Right lower leg: No edema.     Left lower leg: No edema.     Right foot: Normal range of motion. No deformity, bunion, Charcot foot, foot drop or prominent metatarsal heads.     Left foot: Normal range of motion. No deformity, bunion, Charcot foot, foot drop or prominent  metatarsal heads.  Feet:     Right foot:     Protective Sensation: 6 sites tested.  6 sites sensed.     Skin integrity: No ulcer, blister, skin breakdown, erythema, warmth, callus, dry skin or fissure.     Toenail Condition: Right toenails are normal.     Left foot:     Protective Sensation: 6 sites tested.  6 sites sensed.     Skin integrity: No ulcer, blister, skin breakdown, erythema, warmth, callus, dry skin or fissure.     Toenail Condition: Left toenails are normal.  Lymphadenopathy:     Cervical: No cervical adenopathy.  Skin:    General: Skin is warm and dry.     Capillary Refill: Capillary refill takes less than 2 seconds.     Coloration: Skin is not pale.     Findings: No erythema or rash.  Neurological:     Mental Status: He is alert and oriented to person, place, and time.     Cranial Nerves: No cranial nerve deficit.     Motor: No abnormal muscle tone.     Coordination: Coordination normal.     Deep Tendon Reflexes: Reflexes are normal and symmetric.  Psychiatric:        Mood and Affect: Mood and affect normal.        Behavior: Behavior normal. Behavior is cooperative.        Thought Content: Thought content normal.        Judgment: Judgment normal.       Assessment/Plan: 1. Encounter for general adult medical examination with abnormal findings Age-appropriate preventive screenings and vaccinations discussed, annual physical exam completed. Routine labs for health maintenance ordered, see below. PHM updated.    2. Type 2 diabetes mellitus with hyperglycemia, without long-term current use of insulin (HCC) A1C is up at  8.1, patient to focus on diet modifications, no change in current medications. Routine labs ordered.  - POCT HgB A1C - liraglutide (VICTOZA) 18 MG/3ML SOPN; INJECT 1.8 MG INTO THE SKIN DAILY.  Dispense: 9 mL; Refill: 3 - CBC with Differential/Platelet - CMP14+EGFR - TSH + free T4  3. Vitamin D deficiency Rule out low vitamin D - Vitamin D (25  hydroxy)  4. Gastroesophageal reflux disease without esophagitis Stable with current medication   5. Other hyperlipidemia Routine lab ordered - Lipid Profile  6. Class 1 obesity due to excess calories with serious comorbidity and body mass index (BMI) of 30.0 to 30.9 in adult He continues to work on diet and lifestyle modifications and is taking medications for his diabetes that can aid in weight loss as well.       General Counseling: Makih verbalizes understanding of the findings of todays visit and agrees with plan of treatment. I have discussed any further diagnostic evaluation that may be needed or ordered today. We also reviewed his medications today. he has been encouraged to call the office with any questions or concerns that should arise related to todays visit.    Orders Placed This Encounter  Procedures   CBC with Differential/Platelet   CMP14+EGFR   TSH + free T4   Vitamin D (25 hydroxy)   Lipid Profile   POCT HgB A1C    Meds ordered this encounter  Medications   liraglutide (VICTOZA) 18 MG/3ML SOPN    Sig: INJECT 1.8 MG INTO THE SKIN DAILY.    Dispense:  9 mL    Refill:  3   pantoprazole (PROTONIX) 40 MG tablet    Sig: Take 1 tablet (40 mg total) by mouth daily.    Dispense:  30 tablet    Refill:  2    Return in about 3 months (around 06/08/2021) for F/U, Recheck A1C, Alyssa PCP.   Total time spent:30 Minutes Time spent includes review of chart, medications, test results, and follow up plan with the patient.   [place] Controlled Substance Database was reviewed by me.  This patient was seen by Jonetta Osgood, FNP-C in collaboration with Dr. Clayborn Bigness as a part of collaborative care agreement.  Alyssa R. Valetta Fuller, MSN, FNP-C Internal medicine

## 2021-03-12 ENCOUNTER — Telehealth: Payer: Self-pay

## 2021-03-12 ENCOUNTER — Other Ambulatory Visit
Admission: RE | Admit: 2021-03-12 | Discharge: 2021-03-12 | Disposition: A | Discharge status: Home / Self Care | Payer: 59 | Source: Ambulatory Visit | Attending: Nurse Practitioner | Admitting: Nurse Practitioner

## 2021-03-12 DIAGNOSIS — E1165 Type 2 diabetes mellitus with hyperglycemia: Secondary | ICD-10-CM | POA: Insufficient documentation

## 2021-03-12 DIAGNOSIS — E7849 Other hyperlipidemia: Secondary | ICD-10-CM | POA: Insufficient documentation

## 2021-03-12 DIAGNOSIS — E559 Vitamin D deficiency, unspecified: Secondary | ICD-10-CM | POA: Diagnosis not present

## 2021-03-12 LAB — COMPREHENSIVE METABOLIC PANEL
ALT: 35 U/L (ref 0–44)
AST: 23 U/L (ref 15–41)
Albumin: 3.9 g/dL (ref 3.5–5.0)
Alkaline Phosphatase: 83 U/L (ref 38–126)
Anion gap: 8 (ref 5–15)
BUN: 15 mg/dL (ref 6–20)
CO2: 27 mmol/L (ref 22–32)
Calcium: 9.1 mg/dL (ref 8.9–10.3)
Chloride: 100 mmol/L (ref 98–111)
Creatinine, Ser: 0.67 mg/dL (ref 0.61–1.24)
GFR, Estimated: >60 mL/min (ref >60)
Glucose, Bld: 169 mg/dL — ABNORMAL HIGH (ref 70–99)
Potassium: 4 mmol/L (ref 3.5–5.1)
Sodium: 135 mmol/L (ref 135–145)
Total Bilirubin: 0.7 mg/dL (ref 0.3–1.2)
Total Protein: 7.3 g/dL (ref 6.5–8.1)

## 2021-03-12 LAB — VITAMIN D 25 HYDROXY (VIT D DEFICIENCY, FRACTURES): Vit D, 25-Hydroxy: 28.04 ng/mL — ABNORMAL LOW (ref 30–100)

## 2021-03-12 LAB — CBC WITH DIFFERENTIAL/PLATELET
Abs Immature Granulocytes: 0.03 10*3/uL (ref 0.00–0.07)
Basophils Absolute: 0 10*3/uL (ref 0.0–0.1)
Basophils Relative: 0 %
Eosinophils Absolute: 0.2 10*3/uL (ref 0.0–0.5)
Eosinophils Relative: 3 %
HCT: 48.9 % (ref 39.0–52.0)
Hemoglobin: 15.5 g/dL (ref 13.0–17.0)
Immature Granulocytes: 0 %
Lymphocytes Relative: 24 %
Lymphs Abs: 2.2 10*3/uL (ref 0.7–4.0)
MCH: 25.5 pg — ABNORMAL LOW (ref 26.0–34.0)
MCHC: 31.7 g/dL (ref 30.0–36.0)
MCV: 80.6 fL (ref 80.0–100.0)
Monocytes Absolute: 0.6 10*3/uL (ref 0.1–1.0)
Monocytes Relative: 7 %
Neutro Abs: 6 10*3/uL (ref 1.7–7.7)
Neutrophils Relative %: 66 %
Platelets: 218 10*3/uL (ref 150–400)
RBC: 6.07 MIL/uL — ABNORMAL HIGH (ref 4.22–5.81)
RDW: 14.3 % (ref 11.5–15.5)
WBC: 9.1 10*3/uL (ref 4.0–10.5)
nRBC: 0 % (ref 0.0–0.2)

## 2021-03-12 LAB — LIPID PANEL
Cholesterol: 133 mg/dL (ref 0–200)
HDL: 46 mg/dL (ref >40)
LDL Cholesterol: 72 mg/dL (ref 0–99)
Total CHOL/HDL Ratio: 2.9 RATIO
Triglycerides: 76 mg/dL (ref <150)
VLDL: 15 mg/dL (ref 0–40)

## 2021-03-12 LAB — T4, FREE: Free T4: 0.96 ng/dL (ref 0.61–1.12)

## 2021-03-12 LAB — TSH: TSH: 2.616 u[IU]/mL (ref 0.350–4.500)

## 2021-03-12 NOTE — Progress Notes (Signed)
Please call and let patient know: Cholesterol levels are normal. Metabolic panel is normal except for elevated glucose level which is chronically elevated. CBC shows erythrocytosis (elevated RBC), and hypochromia (low MCH). Will consider repeat CBC in a month. If numbers remain abnormal may refer to hematology for further evaluation.

## 2021-03-12 NOTE — Telephone Encounter (Signed)
-----   Message from Sallyanne Kuster, NP sent at 03/12/2021 11:11 AM EDT ----- Please call and let patient know: Cholesterol levels are normal. Metabolic panel is normal except for elevated glucose level which is chronically elevated. CBC shows erythrocytosis (elevated RBC), and hypochromia (low MCH). Will consider repeat CBC in a month. If numbers remain abnormal may refer to hematology for further evaluation.

## 2021-03-15 ENCOUNTER — Other Ambulatory Visit: Payer: Self-pay

## 2021-03-25 ENCOUNTER — Encounter: Payer: Self-pay | Admitting: Nurse Practitioner

## 2021-04-05 ENCOUNTER — Other Ambulatory Visit: Payer: Self-pay

## 2021-04-09 ENCOUNTER — Telehealth: Payer: Self-pay

## 2021-04-13 ENCOUNTER — Other Ambulatory Visit: Payer: Self-pay

## 2021-04-23 NOTE — Telephone Encounter (Signed)
Send message to Austin Hardin 

## 2021-04-26 DIAGNOSIS — H5213 Myopia, bilateral: Secondary | ICD-10-CM | POA: Diagnosis not present

## 2021-04-26 DIAGNOSIS — E119 Type 2 diabetes mellitus without complications: Secondary | ICD-10-CM | POA: Diagnosis not present

## 2021-05-09 ENCOUNTER — Other Ambulatory Visit: Payer: Self-pay

## 2021-05-09 ENCOUNTER — Ambulatory Visit: Payer: 59 | Admitting: Nurse Practitioner

## 2021-05-09 ENCOUNTER — Encounter: Payer: Self-pay | Admitting: Nurse Practitioner

## 2021-05-09 VITALS — BP 130/85 | HR 79 | Temp 98.6°F | Resp 16 | Ht 65.0 in | Wt 182.4 lb

## 2021-05-09 DIAGNOSIS — Z6830 Body mass index (BMI) 30.0-30.9, adult: Secondary | ICD-10-CM | POA: Diagnosis not present

## 2021-05-09 DIAGNOSIS — B372 Candidiasis of skin and nail: Secondary | ICD-10-CM

## 2021-05-09 DIAGNOSIS — E6609 Other obesity due to excess calories: Secondary | ICD-10-CM

## 2021-05-09 DIAGNOSIS — E1165 Type 2 diabetes mellitus with hyperglycemia: Secondary | ICD-10-CM

## 2021-05-09 MED ORDER — DAPAGLIFLOZIN PROPANEDIOL 10 MG PO TABS
ORAL_TABLET | Freq: Every day | ORAL | 1 refills | Status: DC
  Filled 2021-05-09: qty 90, 90d supply, fill #0

## 2021-05-09 MED ORDER — METFORMIN HCL ER 500 MG PO TB24
500.0000 mg | ORAL_TABLET | Freq: Every day | ORAL | 1 refills | Status: DC
Start: 2021-05-09 — End: 2021-06-08
  Filled 2021-05-09: qty 90, 90d supply, fill #0

## 2021-05-09 MED ORDER — TIRZEPATIDE 5 MG/0.5ML ~~LOC~~ SOAJ
5.0000 mg | SUBCUTANEOUS | 0 refills | Status: DC
Start: 2021-05-09 — End: 2021-06-08
  Filled 2021-05-09: qty 2, 28d supply, fill #0
  Filled 2021-05-22 – 2021-05-31 (×2): qty 2, 28d supply, fill #1

## 2021-05-09 MED ORDER — CLOTRIMAZOLE-BETAMETHASONE 1-0.05 % EX CREA
1.0000 "application " | TOPICAL_CREAM | Freq: Two times a day (BID) | CUTANEOUS | 2 refills | Status: DC
  Filled 2021-05-09: qty 45, 23d supply, fill #0
  Filled 2021-11-27: qty 45, 23d supply, fill #1

## 2021-05-09 NOTE — Progress Notes (Signed)
[facility] [place], [place] [address]  Internal MEDICINE  Office Visit Note  Patient Name: Austin Hardin  [id number]  [id number]  Date of Service: 05/09/2021  Chief Complaint  Patient presents with   Follow-up    Discuss meds   Diabetes    HPI Austin Hardin presents for a follow up visit for diabetes and new medication.  He was started on Mounjaro.  He has lost 7 pounds.  He denies any adverse side effects of the medication.  At his next office visit he will have his A1c checked.      Current Medication: Outpatient Encounter Medications as of 05/09/2021  Medication Sig   aspirin 81 MG tablet Take 81 mg by mouth daily.   azelastine (ASTELIN) 0.1 % nasal spray PLACE 2 SPRAYS INTO BOTH NOSTRILS TWO TIMES DAILY AS DIRECTED   [EXPIRED] Blood Glucose Monitoring Suppl (FREESTYLE FREEDOM LITE) w/Device KIT USE AS DIRECTED TO CHECK BLOOD SUGAR EVERY MORNING AND EVERY NIGHT AT BEDTIME   clotrimazole-betamethasone (LOTRISONE) cream Apply 1 application topically 2 (two) times daily.   [EXPIRED] glucose blood test strip USE AS INSTRUCTED TWICE A DAILY   glucose monitoring kit (FREESTYLE) monitoring kit 1 each by Does not apply route in the morning and at bedtime. Dx e11.65   IRON PO Take 65 mg by mouth.   [EXPIRED] Lancets (FREESTYLE) lancets USE AS DIRECTED TO CHECK BLOOD SUGAR EVERY MORNING AND EVERY NIGHT AT BEDTIME   metFORMIN (GLUCOPHAGE-XR) 500 MG 24 hr tablet Take 1 tablet (500 mg total) by mouth daily with breakfast.   nystatin ointment (MYCOSTATIN) Apply 1 application topically 2 (two) times daily.   omeprazole (PRILOSEC) 40 MG capsule TAKE 1 CAPSULE BY MOUTH AS NEEDED   pantoprazole (PROTONIX) 40 MG tablet Take 1 tablet (40 mg total) by mouth daily.   tirzepatide [facility]) 5 MG/0.5ML Pen Inject 5 mg into the skin once a week.   [DISCONTINUED] dapagliflozin propanediol (FARXIGA) 10 MG TABS tablet TAKE 1 TABLET (10 MG TOTAL) BY MOUTH DAILY.   [DISCONTINUED]  Insulin Pen Needle (UNIFINE PENTIPS) 32G X 4 MM MISC FOR USE WITH ONCE DAILY VICTOZA   [DISCONTINUED] liraglutide (VICTOZA) 18 MG/3ML SOPN INJECT 1.8 MG INTO THE SKIN DAILY.   [DISCONTINUED] metFORMIN (GLUCOPHAGE) 500 MG tablet TAKE TWO TABLETS BY MOUTH EVERY MORNING AND TWO TABLETS BY MOUTH EVERY EVENING   dapagliflozin propanediol (FARXIGA) 10 MG TABS tablet TAKE 1 TABLET (10 MG TOTAL) BY MOUTH DAILY.   No facility-administered encounter medications on file as of 05/09/2021.    Surgical History: Past Surgical History:  Procedure Laterality Date   HERNIA REPAIR  1991    Medical History: Past Medical History:  Diagnosis Date   Allergy    environmental   Diabetes mellitus without complication ([place])     Family History: Family History  Problem Relation Age of Onset   Diabetes Mother    Hyperlipidemia Mother    Hypertension Mother    Cancer Father    Diabetes Father    Hyperlipidemia Father    Hypertension Father    Heart disease Father    Diabetes Brother    Diabetes Brother     Social History   Socioeconomic History   Marital status: Married    Spouse name: Not on file   Number of children: Not on file   Years of education: Not on file   Highest education level: Not on file  Occupational History   Not on file  Tobacco Use  Smoking status: Never   Smokeless tobacco: Never  Vaping Use   Vaping Use: Never used  Substance and Sexual Activity   Alcohol use: No    Alcohol/week: 0.0 standard drinks   Drug use: No   Sexual activity: Not on file  Other Topics Concern   Not on file  Social History Narrative   Not on file   Social Determinants of Health   Financial Resource Strain: Not on file  Food Insecurity: Not on file  Transportation Needs: Not on file  Physical Activity: Not on file  Stress: Not on file  Social Connections: Not on file  Intimate Partner Violence: Not on file      Review of Systems  Constitutional:  Negative for chills, fatigue and  unexpected weight change.  HENT:  Negative for congestion, rhinorrhea, sneezing and sore throat.   Eyes:  Negative for redness.  Respiratory:  Negative for cough, chest tightness and shortness of breath.   Cardiovascular:  Negative for chest pain and palpitations.  Gastrointestinal:  Negative for abdominal pain, constipation, diarrhea, nausea and vomiting.  Genitourinary:  Negative for dysuria and frequency.  Musculoskeletal:  Negative for arthralgias, back pain, joint swelling and neck pain.  Skin:  Negative for rash.  Neurological: Negative.  Negative for tremors and numbness.  Hematological:  Negative for adenopathy. Does not bruise/bleed easily.  Psychiatric/Behavioral:  Negative for behavioral problems (Depression), sleep disturbance and suicidal ideas. The patient is not nervous/anxious.    Vital Signs: BP 130/85   Pulse 79   Temp 98.6 F (37 C)   Resp 16   Ht '5\' 5"'  (1.651 m)   Wt 182 lb 6.4 oz (82.7 kg)   SpO2 99%   BMI 30.35 kg/m    Physical Exam Vitals reviewed.  Constitutional:      General: He is not in acute distress.    Appearance: Normal appearance. He is obese. He is not ill-appearing.  HENT:     Head: Normocephalic and atraumatic.  Eyes:     Pupils: Pupils are equal, round, and reactive to light.  Cardiovascular:     Rate and Rhythm: Normal rate and regular rhythm.  Pulmonary:     Effort: Pulmonary effort is normal. No respiratory distress.  Neurological:     Mental Status: He is alert and oriented to person, place, and time.     Cranial Nerves: No cranial nerve deficit.     Coordination: Coordination normal.     Gait: Gait normal.  Psychiatric:        Mood and Affect: Mood normal.        Behavior: Behavior normal.       Assessment/Plan: 1. Type 2 diabetes mellitus with hyperglycemia, without long-term current use of insulin (HCC) Mounjaro dose increased to 5 mg weekly, continue metformin and Farxiga as prescribed.  Next office visit will repeat  A1c. - tirzepatide (MOUNJARO) 5 MG/0.5ML Pen; Inject 5 mg into the skin once a week.  Dispense: 4 mL; Refill: 0 - metFORMIN (GLUCOPHAGE-XR) 500 MG 24 hr tablet; Take 1 tablet (500 mg total) by mouth daily with breakfast.  Dispense: 90 tablet; Refill: 1 - dapagliflozin propanediol (FARXIGA) 10 MG TABS tablet; TAKE 1 TABLET (10 MG TOTAL) BY MOUTH DAILY.  Dispense: 90 tablet; Refill: 1  2. Cutaneous candidiasis Refill ordered - clotrimazole-betamethasone (LOTRISONE) cream; Apply 1 application topically 2 (two) times daily.  Dispense: 45 g; Refill: 2  3. Class 1 obesity due to excess calories with serious comorbidity and body mass  index (BMI) of 30.0 to 30.9 in adult Patient is currently taking 3 different diabetic medications that also aid in weight loss, patient has been working on diet modifications as well, patient has lost 7 pounds since previous office visit.   General Counseling: Austin Hardin verbalizes understanding of the findings of todays visit and agrees with plan of treatment. I have discussed any further diagnostic evaluation that may be needed or ordered today. We also reviewed his medications today. he has been encouraged to call the office with any questions or concerns that should arise related to todays visit.    No orders of the defined types were placed in this encounter.   Meds ordered this encounter  Medications   tirzepatide (MOUNJARO) 5 MG/0.5ML Pen    Sig: Inject 5 mg into the skin once a week.    Dispense:  4 mL    Refill:  0    Victoza was discontinued, patient is taking mounjaro now.   clotrimazole-betamethasone (LOTRISONE) cream    Sig: Apply 1 application topically 2 (two) times daily.    Dispense:  45 g    Refill:  2   metFORMIN (GLUCOPHAGE-XR) 500 MG 24 hr tablet    Sig: Take 1 tablet (500 mg total) by mouth daily with breakfast.    Dispense:  90 tablet    Refill:  1    Please discontinue the regular metformin, patient has been switched to metformin XR    dapagliflozin propanediol (FARXIGA) 10 MG TABS tablet    Sig: TAKE 1 TABLET (10 MG TOTAL) BY MOUTH DAILY.    Dispense:  90 tablet    Refill:  1    Return in about 6 weeks (around 06/20/2021) for F/U, eval new med, [place] PCP.   Total time spent:30 Minutes Time spent includes review of chart, medications, test results, and follow up plan with the patient.   [place] Controlled Substance Database was reviewed by me.  This patient was seen by Jonetta Osgood, FNP-C in collaboration with Dr. Clayborn Bigness as a part of collaborative care agreement.   Nazarene Bunning R. Valetta Fuller, MSN, FNP-C Internal medicine

## 2021-05-22 ENCOUNTER — Other Ambulatory Visit: Payer: Self-pay

## 2021-05-31 ENCOUNTER — Other Ambulatory Visit: Payer: Self-pay

## 2021-06-08 ENCOUNTER — Other Ambulatory Visit: Payer: Self-pay

## 2021-06-08 ENCOUNTER — Ambulatory Visit (INDEPENDENT_AMBULATORY_CARE_PROVIDER_SITE_OTHER): Payer: 59 | Admitting: Nurse Practitioner

## 2021-06-08 ENCOUNTER — Encounter: Payer: Self-pay | Admitting: Nurse Practitioner

## 2021-06-08 VITALS — BP 159/91 | HR 68 | Temp 98.4°F | Resp 16 | Ht 66.0 in | Wt 181.4 lb

## 2021-06-08 DIAGNOSIS — E1165 Type 2 diabetes mellitus with hyperglycemia: Secondary | ICD-10-CM | POA: Diagnosis not present

## 2021-06-08 DIAGNOSIS — R030 Elevated blood-pressure reading, without diagnosis of hypertension: Secondary | ICD-10-CM | POA: Diagnosis not present

## 2021-06-08 DIAGNOSIS — E6609 Other obesity due to excess calories: Secondary | ICD-10-CM

## 2021-06-08 DIAGNOSIS — Z6830 Body mass index (BMI) 30.0-30.9, adult: Secondary | ICD-10-CM | POA: Diagnosis not present

## 2021-06-08 LAB — POCT GLYCOSYLATED HEMOGLOBIN (HGB A1C): Hemoglobin A1C: 7.6 % — AB (ref 4.0–5.6)

## 2021-06-08 MED ORDER — TIRZEPATIDE 7.5 MG/0.5ML ~~LOC~~ SOAJ
7.5000 mg | SUBCUTANEOUS | 1 refills | Status: DC
  Filled 2021-06-08: qty 6, 84d supply, fill #0
  Filled 2021-08-21: qty 2, 28d supply, fill #1

## 2021-06-08 MED ORDER — DAPAGLIFLOZIN PROPANEDIOL 10 MG PO TABS
ORAL_TABLET | Freq: Every day | ORAL | 1 refills | Status: DC
  Filled 2021-06-08: qty 30, 30d supply, fill #0
  Filled 2021-06-08: qty 90, 90d supply, fill #0
  Filled 2021-09-13: qty 90, 90d supply, fill #1
  Filled 2021-12-23: qty 60, 60d supply, fill #2

## 2021-06-08 MED ORDER — METFORMIN HCL ER 500 MG PO TB24
1000.0000 mg | ORAL_TABLET | Freq: Every day | ORAL | 1 refills | Status: DC
Start: 2021-06-08 — End: 2022-01-03
  Filled 2021-06-08 (×2): qty 180, 90d supply, fill #0
  Filled 2021-09-13: qty 180, 90d supply, fill #1

## 2021-06-08 NOTE — Progress Notes (Signed)
[facility] [place], [place] [address]  Internal MEDICINE  Office Visit Note  Patient Name: Austin Hardin  [id number]  [id number]  Date of Service: 06/08/2021  Chief Complaint  Patient presents with   Follow-up   Diabetes    HPI [name] presents for a follow up visit for diabetes and a1c. He has been taking Mounjaro for several weeks now. His A1C is 7.6 today which is a significant improvement from 8.1 in September.in approximately 1 week, he is traveling to [place] for 3 months. And is in need of 3 month refills of his medications. He has lost a total of 8 lbs since 03/09/21. He wants to increase mounjaro dose to 7.5 mg. He denies anty adverse side effects from the medication.    Current Medication: Outpatient Encounter Medications as of 06/08/2021  Medication Sig   aspirin 81 MG tablet Take 81 mg by mouth daily.   azelastine (ASTELIN) 0.1 % nasal spray PLACE 2 SPRAYS INTO BOTH NOSTRILS TWO TIMES DAILY AS DIRECTED   clotrimazole-betamethasone (LOTRISONE) cream Apply 1 application topically 2 (two) times daily.   glucose monitoring kit (FREESTYLE) monitoring kit 1 each by Does not apply route in the morning and at bedtime. Dx e11.65   IRON PO Take 65 mg by mouth.   nystatin ointment (MYCOSTATIN) Apply 1 application topically 2 (two) times daily.   pantoprazole (PROTONIX) 40 MG tablet Take 1 tablet (40 mg total) by mouth daily.   tirzepatide (MOUNJARO) 7.5 MG/0.5ML Pen Inject 7.5 mg into the skin once a week.   [DISCONTINUED] dapagliflozin propanediol (FARXIGA) 10 MG TABS tablet TAKE 1 TABLET (10 MG TOTAL) BY MOUTH DAILY.   [DISCONTINUED] metFORMIN (GLUCOPHAGE-XR) 500 MG 24 hr tablet Take 1 tablet (500 mg total) by mouth daily with breakfast.   [DISCONTINUED] tirzepatide (MOUNJARO) 5 MG/0.5ML Pen Inject 5 mg into the skin once a week.   dapagliflozin propanediol (FARXIGA) 10 MG TABS tablet TAKE 1 TABLET (10 MG TOTAL) BY MOUTH DAILY.   metFORMIN  (GLUCOPHAGE-XR) 500 MG 24 hr tablet Take 2 tablets (1,000 mg total) by mouth daily with breakfast. May take 1 in AM and 1 in PM if desired.   omeprazole (PRILOSEC) 40 MG capsule TAKE 1 CAPSULE BY MOUTH AS NEEDED   No facility-administered encounter medications on file as of 06/08/2021.    Surgical History: Past Surgical History:  Procedure Laterality Date   HERNIA REPAIR  1991    Medical History: Past Medical History:  Diagnosis Date   Allergy    environmental   Diabetes mellitus without complication ([place])     Family History: Family History  Problem Relation Age of Onset   Diabetes Mother    Hyperlipidemia Mother    Hypertension Mother    Cancer Father    Diabetes Father    Hyperlipidemia Father    Hypertension Father    Heart disease Father    Diabetes Brother    Diabetes Brother     Social History   Socioeconomic History   Marital status: Married    Spouse name: Not on file   Number of children: Not on file   Years of education: Not on file   Highest education level: Not on file  Occupational History   Not on file  Tobacco Use   Smoking status: Never   Smokeless tobacco: Never  Vaping Use   Vaping Use: Never used  Substance and Sexual Activity   Alcohol use: No    Alcohol/week: 0.0 standard drinks  Drug use: No   Sexual activity: Not on file  Other Topics Concern   Not on file  Social History Narrative   Not on file   Social Determinants of Health   Financial Resource Strain: Not on file  Food Insecurity: Not on file  Transportation Needs: Not on file  Physical Activity: Not on file  Stress: Not on file  Social Connections: Not on file  Intimate Partner Violence: Not on file      Review of Systems  Constitutional:  Negative for chills, fatigue and unexpected weight change.  HENT:  Negative for congestion, rhinorrhea, sneezing and sore throat.   Eyes:  Negative for redness.  Respiratory:  Negative for cough, chest tightness and shortness  of breath.   Cardiovascular:  Negative for chest pain and palpitations.  Gastrointestinal:  Negative for abdominal pain, constipation, diarrhea, nausea and vomiting.  Genitourinary:  Negative for dysuria and frequency.  Musculoskeletal:  Negative for arthralgias, back pain, joint swelling and neck pain.  Skin:  Negative for rash.  Neurological: Negative.  Negative for tremors and numbness.  Hematological:  Negative for adenopathy. Does not bruise/bleed easily.  Psychiatric/Behavioral:  Negative for behavioral problems (Depression), sleep disturbance and suicidal ideas. The patient is not nervous/anxious.    Vital Signs: BP (!) 159/91    Pulse 68    Temp 98.4 F (36.9 C)    Resp 16    Ht '5\' 6"'  (1.676 m)    Wt 181 lb 6.4 oz (82.3 kg)    SpO2 100%    BMI 29.28 kg/m    Physical Exam Vitals reviewed.  Constitutional:      General: He is not in acute distress.    Appearance: Normal appearance. He is obese. He is not ill-appearing.  HENT:     Head: Normocephalic and atraumatic.  Eyes:     Pupils: Pupils are equal, round, and reactive to light.  Cardiovascular:     Rate and Rhythm: Normal rate and regular rhythm.  Pulmonary:     Effort: Pulmonary effort is normal. No respiratory distress.  Neurological:     Mental Status: He is alert and oriented to person, place, and time.     Cranial Nerves: No cranial nerve deficit.     Coordination: Coordination normal.     Gait: Gait normal.  Psychiatric:        Mood and Affect: Mood normal.        Behavior: Behavior normal.       Assessment/Plan: 1. Type 2 diabetes mellitus with hyperglycemia, without long-term current use of insulin (HCC) A1C is improving, refills of farxiga sent. Metformin dose increased. Mounjaro dose increased. Follow up in 3 months after he returnsfrom [place] for repeat a1c.  - POCT HgB A1C - dapagliflozin propanediol (FARXIGA) 10 MG TABS tablet; TAKE 1 TABLET (10 MG TOTAL) BY MOUTH DAILY.  Dispense: 90 tablet; Refill:  1 - metFORMIN (GLUCOPHAGE-XR) 500 MG 24 hr tablet; Take 2 tablets (1,000 mg total) by mouth daily with breakfast. May take 1 in AM and 1 in PM if desired.  Dispense: 180 tablet; Refill: 1 - tirzepatide (MOUNJARO) 7.5 MG/0.5ML Pen; Inject 7.5 mg into the skin once a week.  Dispense: 6 mL; Refill: 1  2. Elevated blood-pressure reading without diagnosis of hypertension Bp elevated, patient reports it is better and normal at home.   3. Class 1 obesity due to excess calories with serious comorbidity and body mass index (BMI) of 30.0 to 30.9 in adult [place] and  metformin doses increased to improve his diabetes and a1c. These medications also have a side effect of possible weight loss and may benefit him in this way as it has so far.    General Counseling: Kein verbalizes understanding of the findings of todays visit and agrees with plan of treatment. I have discussed any further diagnostic evaluation that may be needed or ordered today. We also reviewed his medications today. he has been encouraged to call the office with any questions or concerns that should arise related to todays visit.    Orders Placed This Encounter  Procedures   POCT HgB A1C    Meds ordered this encounter  Medications   dapagliflozin propanediol (FARXIGA) 10 MG TABS tablet    Sig: TAKE 1 TABLET (10 MG TOTAL) BY MOUTH DAILY.    Dispense:  90 tablet    Refill:  1   metFORMIN (GLUCOPHAGE-XR) 500 MG 24 hr tablet    Sig: Take 2 tablets (1,000 mg total) by mouth daily with breakfast. May take 1 in AM and 1 in PM if desired.    Dispense:  180 tablet    Refill:  1    Please note dose change, increased to 2 tablets daily.   tirzepatide (MOUNJARO) 7.5 MG/0.5ML Pen    Sig: Inject 7.5 mg into the skin once a week.    Dispense:  6 mL    Refill:  1    Return in about 3 months (around 09/06/2021) for F/U, Recheck A1C, Kyiah Canepa PCP.   Total time spent:30 Minutes Time spent includes review of chart, medications, test  results, and follow up plan with the patient.   [place] Controlled Substance Database was reviewed by me.  This patient was seen by Jonetta Osgood, FNP-C in collaboration with Dr. Clayborn Bigness as a part of collaborative care agreement.   Elizaveta Mattice R. Valetta Fuller, MSN, FNP-C Internal medicine

## 2021-06-11 ENCOUNTER — Other Ambulatory Visit: Payer: Self-pay

## 2021-06-12 ENCOUNTER — Ambulatory Visit: Payer: 59 | Admitting: Nurse Practitioner

## 2021-08-21 ENCOUNTER — Other Ambulatory Visit: Payer: Self-pay

## 2021-08-22 ENCOUNTER — Other Ambulatory Visit: Payer: Self-pay

## 2021-09-13 ENCOUNTER — Other Ambulatory Visit: Payer: Self-pay

## 2021-09-13 ENCOUNTER — Encounter: Payer: Self-pay | Admitting: Nurse Practitioner

## 2021-09-13 ENCOUNTER — Ambulatory Visit: Payer: 59 | Admitting: Nurse Practitioner

## 2021-09-13 VITALS — BP 140/70 | HR 83 | Temp 98.3°F | Resp 16 | Ht 66.0 in | Wt 177.0 lb

## 2021-09-13 DIAGNOSIS — Z6830 Body mass index (BMI) 30.0-30.9, adult: Secondary | ICD-10-CM

## 2021-09-13 DIAGNOSIS — E6609 Other obesity due to excess calories: Secondary | ICD-10-CM

## 2021-09-13 DIAGNOSIS — E1165 Type 2 diabetes mellitus with hyperglycemia: Secondary | ICD-10-CM

## 2021-09-13 DIAGNOSIS — R030 Elevated blood-pressure reading, without diagnosis of hypertension: Secondary | ICD-10-CM | POA: Diagnosis not present

## 2021-09-13 LAB — POCT GLYCOSYLATED HEMOGLOBIN (HGB A1C): Hemoglobin A1C: 7.5 % — AB (ref 4.0–5.6)

## 2021-09-13 MED ORDER — TIRZEPATIDE 10 MG/0.5ML ~~LOC~~ SOAJ
10.0000 mg | SUBCUTANEOUS | 2 refills | Status: DC
  Filled 2021-09-13: qty 2, 28d supply, fill #0
  Filled 2021-10-31: qty 2, 28d supply, fill #1
  Filled 2021-11-27: qty 2, 28d supply, fill #2
  Filled 2021-12-23: qty 4, 56d supply, fill #3

## 2021-09-13 NOTE — Progress Notes (Signed)
[facility] ?[address] ?[place], [place] [address] ? ?Internal MEDICINE  ?Office Visit Note ? ?Patient Name: Austin Hardin ? [id number]  ?[id number] ? ?Date of Service: 09/13/2021 ? ?Chief Complaint  ?Patient presents with  ? Follow-up  ? Diabetes  ? ? ?HPI ?Austin Hardin presents for a follow up visit for diabetes and hypertension. His A1C was 7.5 today which is improved by 0.1 since his previous A1C in January.  ?Blood pressure is stable and he is not on any medications. He has no other concerns. He has had some blood glucose levels in the 200s still. There is still room to move up on the dose of mounjaro.  ? ? ? ? ?Current Medication: ?Outpatient Encounter Medications as of 09/13/2021  ?Medication Sig  ? aspirin 81 MG tablet Take 81 mg by mouth daily.  ? clotrimazole-betamethasone (LOTRISONE) cream Apply 1 application topically 2 (two) times daily.  ? dapagliflozin propanediol (FARXIGA) 10 MG TABS tablet TAKE 1 TABLET (10 MG TOTAL) BY MOUTH DAILY.  ? glucose monitoring kit (FREESTYLE) monitoring kit 1 each by Does not apply route in the morning and at bedtime. Dx e11.65  ? IRON PO Take 65 mg by mouth.  ? metFORMIN (GLUCOPHAGE-XR) 500 MG 24 hr tablet Take 2 tablets (1,000 mg total) by mouth daily with breakfast. May take 1 in AM and 1 in PM if desired.  ? nystatin ointment (MYCOSTATIN) Apply 1 application topically 2 (two) times daily.  ? pantoprazole (PROTONIX) 40 MG tablet Take 1 tablet (40 mg total) by mouth daily.  ? [START ON 09/20/2021] tirzepatide (MOUNJARO) 10 MG/0.5ML Pen Inject 10 mg into the skin once a week.  ? [DISCONTINUED] tirzepatide (MOUNJARO) 7.5 MG/0.5ML Pen Inject 7.5 mg into the skin once a week.  ? azelastine (ASTELIN) 0.1 % nasal spray PLACE 2 SPRAYS INTO BOTH NOSTRILS TWO TIMES DAILY AS DIRECTED  ? omeprazole (PRILOSEC) 40 MG capsule TAKE 1 CAPSULE BY MOUTH AS NEEDED  ? ?No facility-administered encounter medications on file as of 09/13/2021.  ? ? ?Surgical History: ?Past Surgical History:   ?Procedure Laterality Date  ? HERNIA REPAIR  1991  ? ? ?Medical History: ?Past Medical History:  ?Diagnosis Date  ? Allergy   ? environmental  ? Diabetes mellitus without complication ([place])   ? ? ?Family History: ?Family History  ?Problem Relation Age of Onset  ? Diabetes Mother   ? Hyperlipidemia Mother   ? Hypertension Mother   ? Cancer Father   ? Diabetes Father   ? Hyperlipidemia Father   ? Hypertension Father   ? Heart disease Father   ? Diabetes Brother   ? Diabetes Brother   ? ? ?Social History  ? ?Socioeconomic History  ? Marital status: Married  ?  Spouse name: Not on file  ? Number of children: Not on file  ? Years of education: Not on file  ? Highest education level: Not on file  ?Occupational History  ? Not on file  ?Tobacco Use  ? Smoking status: Never  ? Smokeless tobacco: Never  ?Vaping Use  ? Vaping Use: Never used  ?Substance and Sexual Activity  ? Alcohol use: No  ?  Alcohol/week: 0.0 standard drinks  ? Drug use: No  ? Sexual activity: Not on file  ?Other Topics Concern  ? Not on file  ?Social History Narrative  ? Not on file  ? ?Social Determinants of Health  ? ?Financial Resource Strain: Not on file  ?Food Insecurity: Not on file  ?Transportation  Needs: Not on file  ?Physical Activity: Not on file  ?Stress: Not on file  ?Social Connections: Not on file  ?Intimate Partner Violence: Not on file  ? ? ? ? ?Review of Systems  ?Constitutional:  Negative for chills, fatigue and unexpected weight change.  ?HENT:  Negative for congestion, rhinorrhea, sneezing and sore throat.   ?Eyes:  Negative for redness.  ?Respiratory: Negative.  Negative for cough, chest tightness and shortness of breath.   ?Cardiovascular: Negative.  Negative for chest pain and palpitations.  ?Gastrointestinal:  Negative for abdominal pain, constipation, diarrhea, nausea and vomiting.  ?Genitourinary:  Negative for dysuria and frequency.  ?Musculoskeletal:  Negative for arthralgias, back pain, joint swelling and neck pain.  ?Skin:   Negative for rash.  ?Neurological: Negative.  Negative for tremors and numbness.  ?Hematological:  Negative for adenopathy. Does not bruise/bleed easily.  ?Psychiatric/Behavioral:  Negative for behavioral problems (Depression), sleep disturbance and suicidal ideas. The patient is not nervous/anxious.   ? ?Vital Signs: ?BP (!) 150/89   Pulse 83   Temp 98.3 ?F (36.8 ?C)   Resp 16   Ht '5\' 6"'  (1.676 m)   Wt 177 lb (80.3 kg)   SpO2 98%   BMI 28.57 kg/m?  ? ? ?Physical Exam ?Vitals reviewed.  ?Constitutional:   ?   Appearance: Normal appearance. He is obese. He is not ill-appearing.  ?HENT:  ?   Head: Normocephalic and atraumatic.  ?Eyes:  ?   Pupils: Pupils are equal, round, and reactive to light.  ?Cardiovascular:  ?   Rate and Rhythm: Normal rate and regular rhythm.  ?Pulmonary:  ?   Effort: Pulmonary effort is normal. No respiratory distress.  ?Neurological:  ?   Mental Status: He is alert and oriented to person, place, and time.  ?Psychiatric:     ?   Mood and Affect: Mood normal.     ?   Behavior: Behavior normal.  ? ? ? ? ? ?Assessment/Plan: ?1. Type 2 diabetes mellitus with hyperglycemia, without long-term current use of insulin ([place]) ?A1C is 7.5, slightly improved from last office visit. Lost 4 lbs from previous office visit. Mounjaro dose increased to 10 mg weekly. Follow up in 3 months for repeat A1C.  ?- POCT HgB A1C ?- tirzepatide (MOUNJARO) 10 MG/0.5ML Pen; Inject 10 mg into the skin once a week.  Dispense: 6 mL; Refill: 2 ? ?2. Elevated blood-pressure reading without diagnosis of hypertension ?Stable, no medication needed yet. Continue to monitor.  ? ?3. Class 1 obesity due to excess calories with serious comorbidity and body mass index (BMI) of 30.0 to 30.9 in adult ?Patient lost 4 lbs since his previous office visit, on mounjaro 7.71m weekly. Mounjaro dose increased to 10 mg weekly. Follow up in 3 months.  ?- tirzepatide (MOUNJARO) 10 MG/0.5ML Pen; Inject 10 mg into the skin once a week.  Dispense:  6 mL; Refill: 2 ? ? ?General Counseling: [name] verbalizes understanding of the findings of todays visit and agrees with plan of treatment. I have discussed any further diagnostic evaluation that may be needed or ordered today. We also reviewed his medications today. he has been encouraged to call the office with any questions or concerns that should arise related to todays visit. ? ? ? ?Orders Placed This Encounter  ?Procedures  ? POCT HgB A1C  ? ? ?Meds ordered this encounter  ?Medications  ? tirzepatide ([facility] 10 MG/0.5ML Pen  ?  Sig: Inject 10 mg into the skin once a week.  ?  Dispense:  6 mL  ?  Refill:  2  ?  Please fill as a 12 week supply per patient request. Dose increased to 10 mg, please discontinue 7.5 mg dose.  ? ? ?Return in about 3 months (around 12/13/2021) for F/U, Recheck A1C, Davelyn Gwinn PCP. ? ? ?Total time spent:30 Minutes ?Time spent includes review of chart, medications, test results, and follow up plan with the patient.  ? ? Controlled Substance Database was reviewed by me. ? ?This patient was seen by Jonetta Osgood, FNP-C in collaboration with Dr. Clayborn Bigness as a part of collaborative care agreement. ? ? ?Aleyah Balik R. Valetta Fuller, MSN, FNP-C ?Internal medicine  ?

## 2021-09-16 ENCOUNTER — Encounter: Payer: Self-pay | Admitting: Nurse Practitioner

## 2021-10-31 ENCOUNTER — Other Ambulatory Visit: Payer: Self-pay

## 2021-11-27 ENCOUNTER — Other Ambulatory Visit: Payer: Self-pay

## 2021-11-27 ENCOUNTER — Other Ambulatory Visit: Payer: Self-pay | Admitting: Internal Medicine

## 2021-11-27 DIAGNOSIS — K219 Gastro-esophageal reflux disease without esophagitis: Secondary | ICD-10-CM

## 2021-11-27 MED ORDER — OMEPRAZOLE 40 MG PO CPDR
DELAYED_RELEASE_CAPSULE | ORAL | 5 refills | Status: DC
Start: 2021-11-27 — End: 2023-01-16
  Filled 2021-11-27: qty 30, 30d supply, fill #0

## 2021-11-28 ENCOUNTER — Other Ambulatory Visit: Payer: Self-pay | Admitting: Nurse Practitioner

## 2021-11-28 ENCOUNTER — Other Ambulatory Visit: Payer: Self-pay

## 2021-11-28 MED FILL — Azelastine HCl Nasal Spray 0.1% (137 MCG/SPRAY): NASAL | 30 days supply | Qty: 30 | Fill #0 | Status: CN

## 2021-11-29 ENCOUNTER — Other Ambulatory Visit: Payer: Self-pay

## 2021-12-23 ENCOUNTER — Other Ambulatory Visit: Payer: Self-pay

## 2021-12-24 ENCOUNTER — Other Ambulatory Visit: Payer: Self-pay

## 2021-12-25 ENCOUNTER — Other Ambulatory Visit: Payer: Self-pay

## 2022-01-03 ENCOUNTER — Ambulatory Visit (INDEPENDENT_AMBULATORY_CARE_PROVIDER_SITE_OTHER): Payer: 59 | Admitting: Nurse Practitioner

## 2022-01-03 ENCOUNTER — Other Ambulatory Visit: Payer: Self-pay

## 2022-01-03 ENCOUNTER — Encounter: Payer: Self-pay | Admitting: Nurse Practitioner

## 2022-01-03 VITALS — BP 134/87 | HR 69 | Temp 98.4°F | Resp 16 | Ht 65.0 in | Wt 179.2 lb

## 2022-01-03 DIAGNOSIS — R030 Elevated blood-pressure reading, without diagnosis of hypertension: Secondary | ICD-10-CM

## 2022-01-03 DIAGNOSIS — E6609 Other obesity due to excess calories: Secondary | ICD-10-CM

## 2022-01-03 DIAGNOSIS — Z6830 Body mass index (BMI) 30.0-30.9, adult: Secondary | ICD-10-CM

## 2022-01-03 DIAGNOSIS — E1165 Type 2 diabetes mellitus with hyperglycemia: Secondary | ICD-10-CM | POA: Diagnosis not present

## 2022-01-03 LAB — POCT GLYCOSYLATED HEMOGLOBIN (HGB A1C): Hemoglobin A1C: 7.2 % — AB (ref 4.0–5.6)

## 2022-01-03 MED ORDER — METFORMIN HCL ER 500 MG PO TB24
1000.0000 mg | ORAL_TABLET | Freq: Every day | ORAL | 1 refills | Status: DC
  Filled 2022-01-03: qty 180, 90d supply, fill #0

## 2022-01-03 MED ORDER — DAPAGLIFLOZIN PROPANEDIOL 10 MG PO TABS
ORAL_TABLET | Freq: Every day | ORAL | 1 refills | Status: DC
  Filled 2022-01-03: qty 90, fill #0
  Filled 2022-02-17: qty 90, 90d supply, fill #0

## 2022-01-03 MED ORDER — TIRZEPATIDE 10 MG/0.5ML ~~LOC~~ SOAJ
10.0000 mg | SUBCUTANEOUS | 2 refills | Status: DC
  Filled 2022-01-03: qty 6, 84d supply, fill #0
  Filled 2022-02-17: qty 2, 28d supply, fill #0

## 2022-01-03 NOTE — Progress Notes (Signed)
[facility] [place], [place] [address]  Internal MEDICINE  Office Visit Note  Patient Name: Austin Hardin  [id number]  [id number]  Date of Service: 01/03/2022  Chief Complaint  Patient presents with   Follow-up   Diabetes    HPI [name] presents for follow-up visit for diabetes.  -- A1c is 7.2 today which is continuing to improve from 7.5 in April earlier this year.  No significant weight loss since previous office visit but patient is continuing to adhere to diabetic diet as much as possible and is continuing to use Mounjaro at the 10 mg dose weekly and reports no significant adverse side effects. -- He has a slightly elevated blood pressure sometimes but has never been diagnosed with hypertension and is not on any medications.  Blood pressure is currently stable and no intervention is needed at this time. --Reports that his ears have been popping and cracking and would like his ears to be checked to make sure that there is no signs of infection or other issue.     Current Medication: Outpatient Encounter Medications as of 01/03/2022  Medication Sig   aspirin 81 MG tablet Take 81 mg by mouth daily.   azelastine (ASTELIN) 0.1 % nasal spray PLACE 2 SPRAYS INTO BOTH NOSTRILS TWO TIMES DAILY AS DIRECTED   clotrimazole-betamethasone (LOTRISONE) cream Apply 1 application topically 2 (two) times daily.   glucose monitoring kit (FREESTYLE) monitoring kit 1 each by Does not apply route in the morning and at bedtime. Dx e11.65   IRON PO Take 65 mg by mouth.   nystatin ointment (MYCOSTATIN) Apply 1 application topically 2 (two) times daily.   omeprazole (PRILOSEC) 40 MG capsule TAKE 1 CAPSULE BY MOUTH AS NEEDED   [DISCONTINUED] dapagliflozin propanediol (FARXIGA) 10 MG TABS tablet TAKE 1 TABLET (10 MG TOTAL) BY MOUTH DAILY.   [DISCONTINUED] metFORMIN (GLUCOPHAGE-XR) 500 MG 24 hr tablet Take 2 tablets (1,000 mg total) by mouth daily with breakfast. May take 1 in AM  and 1 in PM if desired.   [DISCONTINUED] pantoprazole (PROTONIX) 40 MG tablet Take 1 tablet (40 mg total) by mouth daily.   [DISCONTINUED] tirzepatide (MOUNJARO) 10 MG/0.5ML Pen Inject 10 mg into the skin once a week.   dapagliflozin propanediol (FARXIGA) 10 MG TABS tablet TAKE 1 TABLET (10 MG TOTAL) BY MOUTH DAILY.   metFORMIN (GLUCOPHAGE-XR) 500 MG 24 hr tablet Take 2 tablets (1,000 mg total) by mouth daily with breakfast. May take 1 in AM and 1 in PM if desired.   tirzepatide (MOUNJARO) 10 MG/0.5ML Pen Inject 10 mg into the skin once a week.   No facility-administered encounter medications on file as of 01/03/2022.    Surgical History: Past Surgical History:  Procedure Laterality Date   HERNIA REPAIR  1991    Medical History: Past Medical History:  Diagnosis Date   Allergy    environmental   Diabetes mellitus without complication ([place])     Family History: Family History  Problem Relation Age of Onset   Diabetes Mother    Hyperlipidemia Mother    Hypertension Mother    Cancer Father    Diabetes Father    Hyperlipidemia Father    Hypertension Father    Heart disease Father    Diabetes Brother    Diabetes Brother     Social History   Socioeconomic History   Marital status: Married    Spouse name: Not on file   Number of children: Not on file  Years of education: Not on file   Highest education level: Not on file  Occupational History   Not on file  Tobacco Use   Smoking status: Never   Smokeless tobacco: Never  Vaping Use   Vaping Use: Never used  Substance and Sexual Activity   Alcohol use: No    Alcohol/week: 0.0 standard drinks of alcohol   Drug use: No   Sexual activity: Not on file  Other Topics Concern   Not on file  Social History Narrative   Not on file   Social Determinants of Health   Financial Resource Strain: Not on file  Food Insecurity: Not on file  Transportation Needs: Not on file  Physical Activity: Not on file  Stress: Not on  file  Social Connections: Not on file  Intimate Partner Violence: Not on file      Review of Systems  Constitutional:  Negative for chills, fatigue and unexpected weight change.  HENT:  Negative for congestion, rhinorrhea, sneezing and sore throat.   Eyes:  Negative for redness.  Respiratory: Negative.  Negative for cough, chest tightness and shortness of breath.   Cardiovascular: Negative.  Negative for chest pain and palpitations.  Gastrointestinal:  Negative for abdominal pain, constipation, diarrhea, nausea and vomiting.  Genitourinary:  Negative for dysuria and frequency.  Musculoskeletal:  Negative for arthralgias, back pain, joint swelling and neck pain.  Skin:  Negative for rash.  Neurological: Negative.  Negative for tremors and numbness.  Hematological:  Negative for adenopathy. Does not bruise/bleed easily.  Psychiatric/Behavioral:  Negative for behavioral problems (Depression), sleep disturbance and suicidal ideas. The patient is not nervous/anxious.     Vital Signs: BP 134/87   Pulse 69   Temp 98.4 F (36.9 C)   Resp 16   Ht '5\' 5"'  (1.651 m)   Wt 179 lb 3.2 oz (81.3 kg)   SpO2 98%   BMI 29.82 kg/m    Physical Exam Vitals reviewed.  Constitutional:      Appearance: Normal appearance. He is obese. He is not ill-appearing.  HENT:     Head: Normocephalic and atraumatic.  Eyes:     Pupils: Pupils are equal, round, and reactive to light.  Cardiovascular:     Rate and Rhythm: Normal rate and regular rhythm.  Pulmonary:     Effort: Pulmonary effort is normal. No respiratory distress.  Neurological:     Mental Status: He is alert and oriented to person, place, and time.  Psychiatric:        Mood and Affect: Mood normal.        Behavior: Behavior normal.        Assessment/Plan: 1. Type 2 diabetes mellitus with hyperglycemia, without long-term current use of insulin (HCC) A1c continues to improve, continue current medications appropriate refills ordered,  no change in current doses, urine microalbumin specimen sent to the lab.  Follow-up in 3 months for repeat A1c - POCT HgB A1C - Microalbumin, urine - metFORMIN (GLUCOPHAGE-XR) 500 MG 24 hr tablet; Take 2 tablets (1,000 mg total) by mouth daily with breakfast. May take 1 in AM and 1 in PM if desired.  Dispense: 180 tablet; Refill: 1 - dapagliflozin propanediol (FARXIGA) 10 MG TABS tablet; TAKE 1 TABLET (10 MG TOTAL) BY MOUTH DAILY.  Dispense: 90 tablet; Refill: 1 - tirzepatide (MOUNJARO) 10 MG/0.5ML Pen; Inject 10 mg into the skin once a week.  Dispense: 6 mL; Refill: 2  2. Elevated blood-pressure reading without diagnosis of hypertension Stable no intervention  at this time  3. Class 1 obesity due to excess calories with serious comorbidity and body mass index (BMI) of 30.0 to 30.9 in adult Continue current dose of Mounjaro at 10 mg weekly - tirzepatide (MOUNJARO) 10 MG/0.5ML Pen; Inject 10 mg into the skin once a week.  Dispense: 6 mL; Refill: 2   General Counseling: Langston verbalizes understanding of the findings of todays visit and agrees with plan of treatment. I have discussed any further diagnostic evaluation that may be needed or ordered today. We also reviewed his medications today. he has been encouraged to call the office with any questions or concerns that should arise related to todays visit.    Orders Placed This Encounter  Procedures   Microalbumin, urine   POCT HgB A1C    Meds ordered this encounter  Medications   metFORMIN (GLUCOPHAGE-XR) 500 MG 24 hr tablet    Sig: Take 2 tablets (1,000 mg total) by mouth daily with breakfast. May take 1 in AM and 1 in PM if desired.    Dispense:  180 tablet    Refill:  1    Please note dose change, increased to 2 tablets daily.   dapagliflozin propanediol (FARXIGA) 10 MG TABS tablet    Sig: TAKE 1 TABLET (10 MG TOTAL) BY MOUTH DAILY.    Dispense:  90 tablet    Refill:  1   tirzepatide (MOUNJARO) 10 MG/0.5ML Pen    Sig: Inject  10 mg into the skin once a week.    Dispense:  6 mL    Refill:  2    Please fill as a 12 week supply per patient request. Dose increased to 10 mg, please discontinue 7.5 mg dose.    Return in about 3 months (around 04/05/2022) for F/U, Recheck A1C, Lauranne Beyersdorf PCP.   Total time spent:30 Minutes Time spent includes review of chart, medications, test results, and follow up plan with the patient.   [place] Controlled Substance Database was reviewed by me.  This patient was seen by Jonetta Osgood, FNP-C in collaboration with Dr. Clayborn Bigness as a part of collaborative care agreement.   Bahja Bence R. Valetta Fuller, MSN, FNP-C Internal medicine

## 2022-01-05 LAB — MICROALBUMIN, URINE: Microalbumin, Urine: 16.3 ug/mL

## 2022-02-11 ENCOUNTER — Encounter: Payer: Self-pay | Admitting: Nurse Practitioner

## 2022-02-13 ENCOUNTER — Telehealth: Payer: Self-pay

## 2022-02-13 DIAGNOSIS — H6983 Other specified disorders of Eustachian tube, bilateral: Secondary | ICD-10-CM

## 2022-02-13 NOTE — Telephone Encounter (Signed)
Please see visit note from 01/03/22

## 2022-02-15 ENCOUNTER — Telehealth: Payer: Self-pay | Admitting: Nurse Practitioner

## 2022-02-15 NOTE — Telephone Encounter (Signed)
Otolaryngology referral sent via [facility] to [facility] 

## 2022-02-17 ENCOUNTER — Other Ambulatory Visit: Payer: Self-pay

## 2022-02-18 ENCOUNTER — Other Ambulatory Visit: Payer: Self-pay

## 2022-02-28 ENCOUNTER — Telehealth: Payer: Self-pay | Admitting: Nurse Practitioner

## 2022-02-28 NOTE — Telephone Encounter (Signed)
Otolaryngology appointment 03/21/22 @  [facility]

## 2022-03-01 ENCOUNTER — Encounter: Payer: 59 | Admitting: Nurse Practitioner

## 2022-03-12 ENCOUNTER — Ambulatory Visit (INDEPENDENT_AMBULATORY_CARE_PROVIDER_SITE_OTHER): Payer: 59 | Admitting: Nurse Practitioner

## 2022-03-12 ENCOUNTER — Other Ambulatory Visit: Payer: Self-pay

## 2022-03-12 ENCOUNTER — Encounter: Payer: Self-pay | Admitting: Nurse Practitioner

## 2022-03-12 VITALS — BP 138/84 | HR 78 | Temp 98.4°F | Resp 16 | Ht 65.0 in | Wt 180.2 lb

## 2022-03-12 DIAGNOSIS — Z760 Encounter for issue of repeat prescription: Secondary | ICD-10-CM

## 2022-03-12 DIAGNOSIS — Z0001 Encounter for general adult medical examination with abnormal findings: Secondary | ICD-10-CM

## 2022-03-12 DIAGNOSIS — Z125 Encounter for screening for malignant neoplasm of prostate: Secondary | ICD-10-CM | POA: Diagnosis not present

## 2022-03-12 DIAGNOSIS — E538 Deficiency of other specified B group vitamins: Secondary | ICD-10-CM | POA: Diagnosis not present

## 2022-03-12 DIAGNOSIS — R300 Dysuria: Secondary | ICD-10-CM

## 2022-03-12 DIAGNOSIS — E559 Vitamin D deficiency, unspecified: Secondary | ICD-10-CM | POA: Diagnosis not present

## 2022-03-12 DIAGNOSIS — E1165 Type 2 diabetes mellitus with hyperglycemia: Secondary | ICD-10-CM | POA: Diagnosis not present

## 2022-03-12 MED ORDER — DAPAGLIFLOZIN PROPANEDIOL 10 MG PO TABS
ORAL_TABLET | Freq: Every day | ORAL | 1 refills | Status: DC
  Filled 2022-03-12: qty 90, fill #0
  Filled 2022-05-13: qty 90, 90d supply, fill #0

## 2022-03-12 MED ORDER — METFORMIN HCL ER 500 MG PO TB24
1000.0000 mg | ORAL_TABLET | Freq: Every day | ORAL | 1 refills | Status: DC
  Filled 2022-03-12 – 2022-03-25 (×2): qty 180, 90d supply, fill #0

## 2022-03-12 MED ORDER — TIRZEPATIDE 10 MG/0.5ML ~~LOC~~ SOAJ
10.0000 mg | SUBCUTANEOUS | 2 refills | Status: DC
  Filled 2022-03-12 – 2022-03-25 (×2): qty 2, 28d supply, fill #0
  Filled 2022-04-15: qty 2, 28d supply, fill #1
  Filled 2022-05-27: qty 2, 28d supply, fill #2

## 2022-03-12 NOTE — Progress Notes (Signed)
[facility] [place], [place] [address]  Internal MEDICINE  Office Visit Note  Patient Name: Austin Hardin  [id number]  [id number]  Date of Service: 03/12/2022  Chief Complaint  Patient presents with   Annual Exam    HPI Austin Hardin presents for an annual well visit and physical exam.  Well-appearing 56 year old male with type 2 diabetes, GERD, and high cholesterol.  Vital signs are stable, as well as weight.  Acid reflux is controlled with omeprazole.  --takes farxiga, metformin and mounjaro for diabetes. Glucose levels continue to improve.  --due for routine colonoscopy in 2025.  --due for routine labs, including PSA, and yearly urine microalbumin --due for refills of meds.  --diabetic eye exam scheduled for November, last exam was 04/2021.  --due for diabetic foot exam today.  --last A1c was 7.2 in late July, will order repeat with annual labs.  No significant changes in diet or lifestyle since last year. Nonsmoker, no alcohol or illicit drug use. No other questions or concerns, and no new or worsening pain.      Current Medication: Outpatient Encounter Medications as of 03/12/2022  Medication Sig   aspirin 81 MG tablet Take 81 mg by mouth daily.   azelastine (ASTELIN) 0.1 % nasal spray PLACE 2 SPRAYS INTO BOTH NOSTRILS TWO TIMES DAILY AS DIRECTED   clotrimazole-betamethasone (LOTRISONE) cream Apply 1 application topically 2 (two) times daily.   glucose monitoring kit (FREESTYLE) monitoring kit 1 each by Does not apply route in the morning and at bedtime. Dx e11.65   IRON PO Take 65 mg by mouth.   nystatin ointment (MYCOSTATIN) Apply 1 application topically 2 (two) times daily.   omeprazole (PRILOSEC) 40 MG capsule TAKE 1 CAPSULE BY MOUTH AS NEEDED   [DISCONTINUED] dapagliflozin propanediol (FARXIGA) 10 MG TABS tablet TAKE 1 TABLET (10 MG TOTAL) BY MOUTH DAILY.   [DISCONTINUED] metFORMIN (GLUCOPHAGE-XR) 500 MG 24 hr tablet Take 2 tablets (1,000  mg total) by mouth daily with breakfast. May take 1 in AM and 1 in PM if desired.   [DISCONTINUED] tirzepatide (MOUNJARO) 10 MG/0.5ML Pen Inject 10 mg into the skin once a week.   dapagliflozin propanediol (FARXIGA) 10 MG TABS tablet TAKE 1 TABLET (10 MG TOTAL) BY MOUTH DAILY.   metFORMIN (GLUCOPHAGE-XR) 500 MG 24 hr tablet Take 2 tablets (1,000 mg total) by mouth daily with breakfast. May take 1 in AM and 1 in PM if desired.   tirzepatide (MOUNJARO) 10 MG/0.5ML Pen Inject 10 mg into the skin once a week.   No facility-administered encounter medications on file as of 03/12/2022.    Surgical History: Past Surgical History:  Procedure Laterality Date   HERNIA REPAIR  1991    Medical History: Past Medical History:  Diagnosis Date   Allergy    environmental   Diabetes mellitus without complication ([place])     Family History: Family History  Problem Relation Age of Onset   Diabetes Mother    Hyperlipidemia Mother    Hypertension Mother    Cancer Father    Diabetes Father    Hyperlipidemia Father    Hypertension Father    Heart disease Father    Diabetes Brother    Diabetes Brother     Social History   Socioeconomic History   Marital status: Married    Spouse name: Not on file   Number of children: Not on file   Years of education: Not on file   Highest education level: Not on  file  Occupational History   Not on file  Tobacco Use   Smoking status: Never   Smokeless tobacco: Never  Vaping Use   Vaping Use: Never used  Substance and Sexual Activity   Alcohol use: No    Alcohol/week: 0.0 standard drinks of alcohol   Drug use: No   Sexual activity: Not on file  Other Topics Concern   Not on file  Social History Narrative   Not on file   Social Determinants of Health   Financial Resource Strain: Not on file  Food Insecurity: Not on file  Transportation Needs: Not on file  Physical Activity: Not on file  Stress: Not on file  Social Connections: Not on file   Intimate Partner Violence: Not on file      Review of Systems  Constitutional:  Negative for activity change, appetite change, chills, fatigue, fever and unexpected weight change.  HENT: Negative.  Negative for congestion, ear pain, rhinorrhea, sore throat and trouble swallowing.   Eyes: Negative.   Respiratory: Negative.  Negative for cough, chest tightness, shortness of breath and wheezing.   Cardiovascular: Negative.  Negative for chest pain.  Gastrointestinal: Negative.  Negative for abdominal pain, blood in stool, constipation, diarrhea, nausea and vomiting.  Endocrine: Negative.   Genitourinary: Negative.  Negative for difficulty urinating, dysuria, frequency, hematuria and urgency.  Musculoskeletal: Negative.  Negative for arthralgias, back pain, joint swelling, myalgias and neck pain.  Skin: Negative.  Negative for rash and wound.  Allergic/Immunologic: Negative.  Negative for immunocompromised state.  Neurological: Negative.  Negative for dizziness, seizures, numbness and headaches.  Hematological: Negative.   Psychiatric/Behavioral: Negative.  Negative for behavioral problems, self-injury and suicidal ideas. The patient is not nervous/anxious.     Vital Signs: BP 138/84   Pulse 78   Temp 98.4 F (36.9 C)   Resp 16   Ht '5\' 5"'  (1.651 m)   Wt 180 lb 3.2 oz (81.7 kg)   SpO2 98%   BMI 29.99 kg/m    Physical Exam Vitals reviewed.  Constitutional:      General: He is awake. He is not in acute distress.    Appearance: Normal appearance. He is well-developed and well-groomed. He is obese. He is not ill-appearing or diaphoretic.  HENT:     Head: Normocephalic and atraumatic.     Right Ear: Tympanic membrane, ear canal and external ear normal.     Left Ear: Tympanic membrane, ear canal and external ear normal.     Nose: Nose normal. No congestion or rhinorrhea.     Mouth/Throat:     Lips: Pink.     Mouth: Mucous membranes are moist.     Pharynx: Oropharynx is clear.  Uvula midline. No oropharyngeal exudate or posterior oropharyngeal erythema.  Eyes:     General: Lids are normal. Vision grossly intact. Gaze aligned appropriately. No scleral icterus.       Right eye: No discharge.        Left eye: No discharge.     Extraocular Movements: Extraocular movements intact.     Conjunctiva/sclera: Conjunctivae normal.     Pupils: Pupils are equal, round, and reactive to light.     Funduscopic exam:    Right eye: Red reflex present.        Left eye: Red reflex present. Neck:     Thyroid: No thyromegaly.     Vascular: No JVD.     Trachea: No tracheal deviation.  Cardiovascular:     Rate and  Rhythm: Normal rate and regular rhythm.     Pulses: Normal pulses.          Dorsalis pedis pulses are 2+ on the right side and 2+ on the left side.       Posterior tibial pulses are 2+ on the right side and 2+ on the left side.     Heart sounds: Normal heart sounds, S1 normal and S2 normal. No murmur heard.    No friction rub. No gallop.  Pulmonary:     Effort: Pulmonary effort is normal. No accessory muscle usage or respiratory distress.     Breath sounds: Normal breath sounds and air entry. No stridor. No wheezing or rales.  Chest:     Chest wall: No tenderness.  Abdominal:     General: Bowel sounds are normal. There is no distension.     Palpations: Abdomen is soft. There is no shifting dullness, fluid wave, mass or pulsatile mass.     Tenderness: There is no abdominal tenderness. There is no guarding or rebound.  Musculoskeletal:        General: No tenderness or deformity. Normal range of motion.     Cervical back: Normal range of motion and neck supple.     Right lower leg: No edema.     Left lower leg: No edema.     Right foot: Normal range of motion. No deformity, bunion, Charcot foot, foot drop or prominent metatarsal heads.     Left foot: Normal range of motion. No deformity, bunion, Charcot foot, foot drop or prominent metatarsal heads.  Feet:     Right  foot:     Protective Sensation: 6 sites tested.  6 sites sensed.     Skin integrity: No ulcer, blister, skin breakdown, erythema, warmth, callus, dry skin or fissure.     Toenail Condition: Right toenails are normal.     Left foot:     Protective Sensation: 6 sites tested.  6 sites sensed.     Skin integrity: No ulcer, blister, skin breakdown, erythema, warmth, callus, dry skin or fissure.     Toenail Condition: Left toenails are normal.  Lymphadenopathy:     Cervical: No cervical adenopathy.  Skin:    General: Skin is warm and dry.     Capillary Refill: Capillary refill takes less than 2 seconds.     Coloration: Skin is not pale.     Findings: No erythema or rash.  Neurological:     Mental Status: He is alert and oriented to person, place, and time.     Cranial Nerves: No cranial nerve deficit.     Motor: No abnormal muscle tone.     Coordination: Coordination normal.     Deep Tendon Reflexes: Reflexes are normal and symmetric.  Psychiatric:        Mood and Affect: Mood and affect normal.        Behavior: Behavior normal. Behavior is cooperative.        Thought Content: Thought content normal.        Judgment: Judgment normal.        Assessment/Plan: 1. Encounter for general adult medical examination with abnormal findings Age-appropriate preventive screenings and vaccinations discussed, annual physical exam completed. Routine labs for health maintenance ordered, see below. PHM updated.  - CBC with Differential/Platelet - CMP14+EGFR - Lipid Profile - Vitamin D (25 hydroxy) - B12 and Folate Panel - Hgb A1C w/o eAG - Urinalysis, Routine w reflex microscopic  2. Type 2 diabetes  mellitus with hyperglycemia, without long-term current use of insulin ([place]) Routine labs ordered, continue medications as prescribed, will call patient with lab results and any recommendations. - CBC with Differential/Platelet - CMP14+EGFR - Lipid Profile - Hgb A1C w/o eAG - Urine Microalbumin  w/creat. ratio - Urinalysis, Routine w reflex microscopic  3. B12 deficiency Routine labs ordered - CBC with Differential/Platelet - B12 and Folate Panel  4. Vitamin D deficiency Routine lab ordered - Vitamin D (25 hydroxy)  5. Dysuria Urinalysis done - Urinalysis, Routine w reflex microscopic - Urinalysis, Routine w reflex microscopic  6. Medication refill - dapagliflozin propanediol (FARXIGA) 10 MG TABS tablet; TAKE 1 TABLET (10 MG TOTAL) BY MOUTH DAILY.  Dispense: 90 tablet; Refill: 1 - metFORMIN (GLUCOPHAGE-XR) 500 MG 24 hr tablet; Take 2 tablets (1,000 mg total) by mouth daily with breakfast. May take 1 in AM and 1 in PM if desired.  Dispense: 180 tablet; Refill: 1 - tirzepatide (MOUNJARO) 10 MG/0.5ML Pen; Inject 10 mg into the skin once a week.  Dispense: 6 mL; Refill: 2  7. Screening for prostate cancer Routine lab ordered - PSA Total (Reflex To Free)      General Counseling: Austin Hardin verbalizes understanding of the findings of todays visit and agrees with plan of treatment. I have discussed any further diagnostic evaluation that may be needed or ordered today. We also reviewed his medications today. he has been encouraged to call the office with any questions or concerns that should arise related to todays visit.    Orders Placed This Encounter  Procedures   Urinalysis, Routine w reflex microscopic   CBC with Differential/Platelet   CMP14+EGFR   Lipid Profile   Vitamin D (25 hydroxy)   B12 and Folate Panel   PSA Total (Reflex To Free)   Hgb A1C w/o eAG   Urine Microalbumin w/creat. ratio   Urinalysis, Routine w reflex microscopic    Meds ordered this encounter  Medications   dapagliflozin propanediol (FARXIGA) 10 MG TABS tablet    Sig: TAKE 1 TABLET (10 MG TOTAL) BY MOUTH DAILY.    Dispense:  90 tablet    Refill:  1   metFORMIN (GLUCOPHAGE-XR) 500 MG 24 hr tablet    Sig: Take 2 tablets (1,000 mg total) by mouth daily with breakfast. May take 1 in AM  and 1 in PM if desired.    Dispense:  180 tablet    Refill:  1    Please note dose change, increased to 2 tablets daily.   tirzepatide [facility]) 10 MG/0.5ML Pen    Sig: Inject 10 mg into the skin once a week.    Dispense:  6 mL    Refill:  2    Please fill as a 12 week supply per patient request. Dose increased to 10 mg, please discontinue 7.5 mg dose.    Return in about 3 months (around 06/12/2022) for F/U, Recheck A1C, Riven Beebe PCP.   Total time spent:30 Minutes Time spent includes review of chart, medications, test results, and follow up plan with the patient.   [place] Controlled Substance Database was reviewed by me.  This patient was seen by Jonetta Osgood, FNP-C in collaboration with Dr. Clayborn Bigness as a part of collaborative care agreement.  Kelii Chittum R. Valetta Fuller, MSN, FNP-C Internal medicine

## 2022-03-13 ENCOUNTER — Other Ambulatory Visit: Payer: Self-pay

## 2022-03-14 ENCOUNTER — Other Ambulatory Visit: Payer: Self-pay

## 2022-03-15 ENCOUNTER — Other Ambulatory Visit
Admission: RE | Admit: 2022-03-15 | Discharge: 2022-03-15 | Disposition: A | Discharge status: Home / Self Care | Payer: 59 | Source: Ambulatory Visit | Attending: Nurse Practitioner | Admitting: Nurse Practitioner

## 2022-03-15 DIAGNOSIS — E6609 Other obesity due to excess calories: Secondary | ICD-10-CM | POA: Diagnosis not present

## 2022-03-15 DIAGNOSIS — Z6830 Body mass index (BMI) 30.0-30.9, adult: Secondary | ICD-10-CM | POA: Insufficient documentation

## 2022-03-15 DIAGNOSIS — E1165 Type 2 diabetes mellitus with hyperglycemia: Secondary | ICD-10-CM | POA: Insufficient documentation

## 2022-03-15 DIAGNOSIS — R300 Dysuria: Secondary | ICD-10-CM | POA: Insufficient documentation

## 2022-03-15 LAB — CBC WITH DIFFERENTIAL/PLATELET
Abs Immature Granulocytes: 0.03 10*3/uL (ref 0.00–0.07)
Basophils Absolute: 0.1 10*3/uL (ref 0.0–0.1)
Basophils Relative: 1 %
Eosinophils Absolute: 0.3 10*3/uL (ref 0.0–0.5)
Eosinophils Relative: 5 %
HCT: 49.7 % (ref 39.0–52.0)
Hemoglobin: 15.8 g/dL (ref 13.0–17.0)
Immature Granulocytes: 1 %
Lymphocytes Relative: 33 %
Lymphs Abs: 1.8 10*3/uL (ref 0.7–4.0)
MCH: 25.7 pg — ABNORMAL LOW (ref 26.0–34.0)
MCHC: 31.8 g/dL (ref 30.0–36.0)
MCV: 80.8 fL (ref 80.0–100.0)
Monocytes Absolute: 0.5 10*3/uL (ref 0.1–1.0)
Monocytes Relative: 8 %
Neutro Abs: 2.9 10*3/uL (ref 1.7–7.7)
Neutrophils Relative %: 52 %
Platelets: 228 10*3/uL (ref 150–400)
RBC: 6.15 MIL/uL — ABNORMAL HIGH (ref 4.22–5.81)
RDW: 13.1 % (ref 11.5–15.5)
WBC: 5.5 10*3/uL (ref 4.0–10.5)
nRBC: 0 % (ref 0.0–0.2)

## 2022-03-15 LAB — URINALYSIS, ROUTINE W REFLEX MICROSCOPIC
Bacteria, UA: NONE SEEN
Bilirubin Urine: NEGATIVE
Glucose, UA: >=500 mg/dL — AB
Hgb urine dipstick: NEGATIVE
Ketones, ur: 5 mg/dL — AB
Leukocytes,Ua: NEGATIVE
Nitrite: NEGATIVE
Protein, ur: NEGATIVE mg/dL
Specific Gravity, Urine: 1.024 (ref 1.005–1.030)
Squamous Epithelial / HPF: NONE SEEN (ref 0–5)
pH: 6 (ref 5.0–8.0)

## 2022-03-15 LAB — COMPREHENSIVE METABOLIC PANEL
ALT: 24 U/L (ref 0–44)
AST: 22 U/L (ref 15–41)
Albumin: 3.8 g/dL (ref 3.5–5.0)
Alkaline Phosphatase: 63 U/L (ref 38–126)
Anion gap: 5 (ref 5–15)
BUN: 16 mg/dL (ref 6–20)
CO2: 28 mmol/L (ref 22–32)
Calcium: 9.1 mg/dL (ref 8.9–10.3)
Chloride: 106 mmol/L (ref 98–111)
Creatinine, Ser: 0.72 mg/dL (ref 0.61–1.24)
GFR, Estimated: >60 mL/min (ref >60)
Glucose, Bld: 109 mg/dL — ABNORMAL HIGH (ref 70–99)
Potassium: 4.3 mmol/L (ref 3.5–5.1)
Sodium: 139 mmol/L (ref 135–145)
Total Bilirubin: 0.8 mg/dL (ref 0.3–1.2)
Total Protein: 7.2 g/dL (ref 6.5–8.1)

## 2022-03-15 LAB — LIPID PANEL
Cholesterol: 118 mg/dL (ref 0–200)
HDL: 42 mg/dL (ref >40)
LDL Cholesterol: 63 mg/dL (ref 0–99)
Total CHOL/HDL Ratio: 2.8 RATIO
Triglycerides: 67 mg/dL (ref <150)
VLDL: 13 mg/dL (ref 0–40)

## 2022-03-15 LAB — PSA: Prostatic Specific Antigen: 0.71 ng/mL (ref 0.00–4.00)

## 2022-03-15 LAB — VITAMIN D 25 HYDROXY (VIT D DEFICIENCY, FRACTURES): Vit D, 25-Hydroxy: 26.7 ng/mL — ABNORMAL LOW (ref 30–100)

## 2022-03-15 LAB — VITAMIN B12: Vitamin B-12: 279 pg/mL (ref 180–914)

## 2022-03-15 LAB — HEMOGLOBIN A1C
Hgb A1c MFr Bld: 6.9 % — ABNORMAL HIGH (ref 4.8–5.6)
Mean Plasma Glucose: 151.33 mg/dL

## 2022-03-15 LAB — FOLATE: Folate: 13.9 ng/mL (ref >5.9)

## 2022-03-16 LAB — MICROALBUMIN / CREATININE URINE RATIO
Creatinine, Urine: 88.3 mg/dL
Microalb Creat Ratio: 3 mg/g creat (ref 0–29)
Microalb, Ur: 3 ug/mL — ABNORMAL HIGH

## 2022-03-18 ENCOUNTER — Other Ambulatory Visit: Payer: Self-pay

## 2022-03-21 ENCOUNTER — Other Ambulatory Visit: Payer: Self-pay

## 2022-03-21 DIAGNOSIS — H6983 Other specified disorders of Eustachian tube, bilateral: Secondary | ICD-10-CM | POA: Diagnosis not present

## 2022-03-21 DIAGNOSIS — J301 Allergic rhinitis due to pollen: Secondary | ICD-10-CM | POA: Diagnosis not present

## 2022-03-21 DIAGNOSIS — H903 Sensorineural hearing loss, bilateral: Secondary | ICD-10-CM | POA: Diagnosis not present

## 2022-03-21 DIAGNOSIS — H9313 Tinnitus, bilateral: Secondary | ICD-10-CM | POA: Diagnosis not present

## 2022-03-21 MED ORDER — FLUTICASONE PROPIONATE 50 MCG/ACT NA SUSP
2.0000 | Freq: Every day | NASAL | 2 refills | Status: AC
  Filled 2022-03-21: qty 16, 30d supply, fill #0

## 2022-03-25 ENCOUNTER — Other Ambulatory Visit: Payer: Self-pay

## 2022-03-27 ENCOUNTER — Other Ambulatory Visit: Payer: Self-pay

## 2022-04-04 ENCOUNTER — Ambulatory Visit: Payer: 59 | Admitting: Nurse Practitioner

## 2022-04-15 ENCOUNTER — Other Ambulatory Visit: Payer: Self-pay

## 2022-04-17 ENCOUNTER — Other Ambulatory Visit: Payer: Self-pay

## 2022-04-19 DIAGNOSIS — E113293 Type 2 diabetes mellitus with mild nonproliferative diabetic retinopathy without macular edema, bilateral: Secondary | ICD-10-CM | POA: Diagnosis not present

## 2022-04-19 DIAGNOSIS — H5213 Myopia, bilateral: Secondary | ICD-10-CM | POA: Diagnosis not present

## 2022-05-13 ENCOUNTER — Other Ambulatory Visit: Payer: Self-pay

## 2022-05-27 ENCOUNTER — Other Ambulatory Visit: Payer: Self-pay

## 2022-06-11 ENCOUNTER — Ambulatory Visit (INDEPENDENT_AMBULATORY_CARE_PROVIDER_SITE_OTHER): Payer: Commercial Managed Care - PPO | Admitting: Nurse Practitioner

## 2022-06-11 ENCOUNTER — Encounter: Payer: Self-pay | Admitting: Nurse Practitioner

## 2022-06-11 ENCOUNTER — Other Ambulatory Visit: Payer: Self-pay

## 2022-06-11 VITALS — BP 135/85 | HR 80 | Temp 98.3°F | Resp 16 | Ht 65.0 in | Wt 181.0 lb

## 2022-06-11 DIAGNOSIS — E1165 Type 2 diabetes mellitus with hyperglycemia: Secondary | ICD-10-CM

## 2022-06-11 DIAGNOSIS — Z760 Encounter for issue of repeat prescription: Secondary | ICD-10-CM | POA: Diagnosis not present

## 2022-06-11 DIAGNOSIS — R030 Elevated blood-pressure reading, without diagnosis of hypertension: Secondary | ICD-10-CM | POA: Diagnosis not present

## 2022-06-11 LAB — POCT GLYCOSYLATED HEMOGLOBIN (HGB A1C): Hemoglobin A1C: 7 % — AB (ref 4.0–5.6)

## 2022-06-11 MED ORDER — METFORMIN HCL ER 500 MG PO TB24
1000.0000 mg | ORAL_TABLET | Freq: Every day | ORAL | 1 refills | Status: DC
  Filled 2022-06-11 – 2022-06-27 (×2): qty 180, 90d supply, fill #0

## 2022-06-11 MED ORDER — TIRZEPATIDE 10 MG/0.5ML ~~LOC~~ SOAJ
10.0000 mg | SUBCUTANEOUS | 2 refills | Status: DC
  Filled 2022-06-11: qty 2, 28d supply, fill #0
  Filled 2022-06-24: qty 6, 84d supply, fill #0

## 2022-06-11 MED ORDER — DAPAGLIFLOZIN PROPANEDIOL 10 MG PO TABS
10.0000 mg | ORAL_TABLET | Freq: Every day | ORAL | 1 refills | Status: DC
  Filled 2022-06-11: qty 90, fill #0
  Filled 2022-08-13: qty 90, 90d supply, fill #0
  Filled 2022-08-15: qty 30, 30d supply, fill #0

## 2022-06-11 NOTE — Progress Notes (Signed)
[facility] [place], [place] [address]  Internal MEDICINE  Office Visit Note  Patient Name: Austin Hardin  [id number]  [id number]  Date of Service: 06/11/2022  Chief Complaint  Patient presents with   Follow-up   Diabetes    HPI Austin Hardin presents for a follow up visit for diabetes and high blood pressure Diabetes -- A1c 7.0, no significant change from last A1c of 6.9 Elevated blood pressure -- stable, not currently on any medication for BP, except farxiga.  Due for med refills    Current Medication: Outpatient Encounter Medications as of 06/11/2022  Medication Sig   aspirin 81 MG tablet Take 81 mg by mouth daily.   azelastine (ASTELIN) 0.1 % nasal spray PLACE 2 SPRAYS INTO BOTH NOSTRILS TWO TIMES DAILY AS DIRECTED   clotrimazole-betamethasone (LOTRISONE) cream Apply 1 application topically 2 (two) times daily.   fluticasone (FLONASE) 50 MCG/ACT nasal spray Use 2 sprays each nostril once daily   glucose monitoring kit (FREESTYLE) monitoring kit 1 each by Does not apply route in the morning and at bedtime. Dx e11.65   IRON PO Take 65 mg by mouth.   nystatin ointment (MYCOSTATIN) Apply 1 application topically 2 (two) times daily.   omeprazole (PRILOSEC) 40 MG capsule TAKE 1 CAPSULE BY MOUTH AS NEEDED   [DISCONTINUED] dapagliflozin propanediol (FARXIGA) 10 MG TABS tablet TAKE 1 TABLET (10 MG TOTAL) BY MOUTH DAILY.   [DISCONTINUED] metFORMIN (GLUCOPHAGE-XR) 500 MG 24 hr tablet Take 2 tablets (1,000 mg total) by mouth daily with breakfast. May take 1 tablet in the morning and 1 tablet in the evening if desired.   [DISCONTINUED] tirzepatide (MOUNJARO) 10 MG/0.5ML Pen Inject 10 mg into the skin once a week.   dapagliflozin propanediol (FARXIGA) 10 MG TABS tablet TAKE 1 TABLET (10 MG TOTAL) BY MOUTH DAILY.   metFORMIN (GLUCOPHAGE-XR) 500 MG 24 hr tablet Take 2 tablets (1,000 mg total) by mouth daily with breakfast. May take 1 tablet in the morning and 1 tablet in  the evening if desired.   tirzepatide (MOUNJARO) 10 MG/0.5ML Pen Inject 10 mg into the skin once a week.   No facility-administered encounter medications on file as of 06/11/2022.    Surgical History: Past Surgical History:  Procedure Laterality Date   HERNIA REPAIR  1991    Medical History: Past Medical History:  Diagnosis Date   Allergy    environmental   Diabetes mellitus without complication ([place])     Family History: Family History  Problem Relation Age of Onset   Diabetes Mother    Hyperlipidemia Mother    Hypertension Mother    Cancer Father    Diabetes Father    Hyperlipidemia Father    Hypertension Father    Heart disease Father    Diabetes Brother    Diabetes Brother     Social History   Socioeconomic History   Marital status: Married    Spouse name: Not on file   Number of children: Not on file   Years of education: Not on file   Highest education level: Not on file  Occupational History   Not on file  Tobacco Use   Smoking status: Never   Smokeless tobacco: Never  Vaping Use   Vaping Use: Never used  Substance and Sexual Activity   Alcohol use: No    Alcohol/week: 0.0 standard drinks of alcohol   Drug use: No   Sexual activity: Not on file  Other Topics Concern   Not on  file  Social History Narrative   Not on file   Social Determinants of Health   Financial Resource Strain: Not on file  Food Insecurity: Not on file  Transportation Needs: Not on file  Physical Activity: Not on file  Stress: Not on file  Social Connections: Not on file  Intimate Partner Violence: Not on file      Review of Systems  Constitutional:  Negative for chills, fatigue and unexpected weight change.  HENT:  Negative for congestion, rhinorrhea, sneezing and sore throat.   Eyes:  Negative for redness.  Respiratory: Negative.  Negative for cough, chest tightness and shortness of breath.   Cardiovascular: Negative.  Negative for chest pain and palpitations.   Gastrointestinal:  Negative for abdominal pain, constipation, diarrhea, nausea and vomiting.  Genitourinary:  Negative for dysuria and frequency.  Musculoskeletal:  Negative for arthralgias, back pain, joint swelling and neck pain.  Skin:  Negative for rash.  Neurological: Negative.  Negative for tremors and numbness.  Hematological:  Negative for adenopathy. Does not bruise/bleed easily.  Psychiatric/Behavioral:  Negative for behavioral problems (Depression), sleep disturbance and suicidal ideas. The patient is not nervous/anxious.     Vital Signs: BP 135/85   Pulse 80   Temp 98.3 F (36.8 C)   Resp 16   Ht _0  (1.651 m)   Wt 181 lb (82.1 kg)   SpO2 99%   BMI 30.12 kg/m    Physical Exam Vitals reviewed.  Constitutional:      Appearance: Normal appearance. He is obese. He is not ill-appearing.  HENT:     Head: Normocephalic and atraumatic.  Eyes:     Pupils: Pupils are equal, round, and reactive to light.  Cardiovascular:     Rate and Rhythm: Normal rate and regular rhythm.  Pulmonary:     Effort: Pulmonary effort is normal. No respiratory distress.  Neurological:     Mental Status: He is alert and oriented to person, place, and time.  Psychiatric:        Mood and Affect: Mood normal.        Behavior: Behavior normal.        Assessment/Plan: 1. Type 2 diabetes mellitus with hyperglycemia, without long-term current use of insulin ([facility]) A1c no significant change but stable. Continue mounjaro, metformin and farxiga as prescribed. Follow up in 3 months for repeat A1c.  - POCT glycosylated hemoglobin (Hb A1C)  2. Elevated blood-pressure reading without diagnosis of hypertension Improved when rechecked. Will continue to monitor periodically .   3. Medication refill - metFORMIN (GLUCOPHAGE-XR) 500 MG 24 hr tablet; Take 2 tablets (1,000 mg total) by mouth daily with breakfast. May take 1 tablet in the morning and 1 tablet in the evening if desired.  Dispense: 180  tablet; Refill: 1 - dapagliflozin propanediol (FARXIGA) 10 MG TABS tablet; TAKE 1 TABLET (10 MG TOTAL) BY MOUTH DAILY.  Dispense: 90 tablet; Refill: 1 - tirzepatide (MOUNJARO) 10 MG/0.5ML Pen; Inject 10 mg into the skin once a week.  Dispense: 6 mL; Refill: 2   General Counseling: Semaje verbalizes understanding of the findings of todays visit and agrees with plan of treatment. I have discussed any further diagnostic evaluation that may be needed or ordered today. We also reviewed his medications today. he has been encouraged to call the office with any questions or concerns that should arise related to todays visit.    Orders Placed This Encounter  Procedures   POCT glycosylated hemoglobin (Hb A1C)    Meds  ordered this encounter  Medications   metFORMIN (GLUCOPHAGE-XR) 500 MG 24 hr tablet    Sig: Take 2 tablets (1,000 mg total) by mouth daily with breakfast. May take 1 tablet in the morning and 1 tablet in the evening if desired.    Dispense:  180 tablet    Refill:  1    Please note dose change, increased to 2 tablets daily.   dapagliflozin propanediol (FARXIGA) 10 MG TABS tablet    Sig: TAKE 1 TABLET (10 MG TOTAL) BY MOUTH DAILY.    Dispense:  90 tablet    Refill:  1   tirzepatide (MOUNJARO) 10 MG/0.5ML Pen    Sig: Inject 10 mg into the skin once a week.    Dispense:  6 mL    Refill:  2    Please fill as a 12 week supply per patient request. Dose increased to 10 mg, please discontinue 7.5 mg dose.    Return in about 3 months (around 09/18/2022) for F/U, Recheck A1C, Griff Badley PCP.   Total time spent:30 Minutes Time spent includes review of chart, medications, test results, and follow up plan with the patient.   [place] Controlled Substance Database was reviewed by me.  This patient was seen by Jonetta Osgood, FNP-C in collaboration with Dr. Clayborn Bigness as a part of collaborative care agreement.   Cara Aguino R. Valetta Fuller, MSN, FNP-C Internal medicine

## 2022-06-24 ENCOUNTER — Other Ambulatory Visit: Payer: Self-pay

## 2022-06-27 ENCOUNTER — Other Ambulatory Visit: Payer: Self-pay

## 2022-08-09 ENCOUNTER — Encounter: Payer: Self-pay | Admitting: Nurse Practitioner

## 2022-08-09 ENCOUNTER — Other Ambulatory Visit: Payer: Self-pay

## 2022-08-09 ENCOUNTER — Ambulatory Visit: Payer: Commercial Managed Care - PPO | Admitting: Nurse Practitioner

## 2022-08-09 VITALS — BP 145/80 | HR 82 | Temp 97.7°F | Resp 16 | Ht 65.0 in | Wt 182.2 lb

## 2022-08-09 DIAGNOSIS — L299 Pruritus, unspecified: Secondary | ICD-10-CM

## 2022-08-09 DIAGNOSIS — L239 Allergic contact dermatitis, unspecified cause: Secondary | ICD-10-CM

## 2022-08-09 MED ORDER — CLOBETASOL PROPIONATE 0.05 % EX CREA
1.0000 | TOPICAL_CREAM | Freq: Two times a day (BID) | CUTANEOUS | 1 refills | Status: DC
  Filled 2022-08-09: qty 45, 45d supply, fill #0

## 2022-08-09 MED ORDER — HYDROXYZINE HCL 25 MG PO TABS
12.5000 mg | ORAL_TABLET | Freq: Three times a day (TID) | ORAL | 1 refills | Status: DC | PRN
  Filled 2022-08-09: qty 60, 20d supply, fill #0

## 2022-08-09 NOTE — Progress Notes (Signed)
[facility] [place], [place] [address]  Internal MEDICINE  Office Visit Note  Patient Name: Austin Hardin  [id number]  [id number]  Date of Service: 08/09/2022  Chief Complaint  Patient presents with   Acute Visit    Rash on back      HPI Caston presents for an acute sick visit for rash Rash on back, started 2 months ago, itchy, feels like something is biting.  No new detergents, soaps, foods or meds  Patchy, maculopapular and slightly scaly    Current Medication:  Outpatient Encounter Medications as of 08/09/2022  Medication Sig   aspirin 81 MG tablet Take 81 mg by mouth daily.   azelastine (ASTELIN) 0.1 % nasal spray PLACE 2 SPRAYS INTO BOTH NOSTRILS TWO TIMES DAILY AS DIRECTED   clobetasol cream (TEMOVATE) 6.78 % Apply 1 Application topically 2 (two) times daily. To affected area until resolved.   clotrimazole-betamethasone (LOTRISONE) cream Apply 1 application topically 2 (two) times daily.   dapagliflozin propanediol (FARXIGA) 10 MG TABS tablet Take 1 tablet (10 mg total) by mouth daily.   fluticasone (FLONASE) 50 MCG/ACT nasal spray Use 2 sprays each nostril once daily   glucose monitoring kit (FREESTYLE) monitoring kit 1 each by Does not apply route in the morning and at bedtime. Dx e11.65   hydrOXYzine (ATARAX) 25 MG tablet Take 0.5-1 tablets (12.5-25 mg total) by mouth 3 (three) times daily as needed for itching.   IRON PO Take 65 mg by mouth.   metFORMIN (GLUCOPHAGE-XR) 500 MG 24 hr tablet Take 2 tablets (1,000 mg total) by mouth daily with breakfast. May take 1 tablet in the morning and 1 tablet in the evening if desired.   nystatin ointment (MYCOSTATIN) Apply 1 application topically 2 (two) times daily.   omeprazole (PRILOSEC) 40 MG capsule TAKE 1 CAPSULE BY MOUTH AS NEEDED   tirzepatide (MOUNJARO) 10 MG/0.5ML Pen Inject 10 mg into the skin once a week.   No facility-administered encounter medications on file as of 08/09/2022.       Medical History: Past Medical History:  Diagnosis Date   Allergy    environmental   Diabetes mellitus without complication (HCC)      Vital Signs: BP (!) 173/82   Pulse 82   Temp 97.7 F (36.5 C)   Resp 16   Ht 5\' 5"  (1.651 m)   Wt 182 lb 3.2 oz (82.6 kg)   SpO2 97%   BMI 30.32 kg/m    Review of Systems  Constitutional:  Negative for chills, fatigue and unexpected weight change.  HENT:  Negative for congestion, rhinorrhea, sneezing and sore throat.   Eyes:  Negative for redness.  Respiratory: Negative.  Negative for cough, chest tightness and shortness of breath.   Cardiovascular: Negative.  Negative for chest pain and palpitations.  Gastrointestinal:  Negative for abdominal pain, constipation, diarrhea, nausea and vomiting.  Genitourinary:  Negative for dysuria and frequency.  Musculoskeletal:  Negative for arthralgias, back pain, joint swelling and neck pain.  Skin:  Positive for rash (on back).  Neurological: Negative.  Negative for tremors and numbness.  Hematological:  Negative for adenopathy. Does not bruise/bleed easily.  Psychiatric/Behavioral:  Negative for behavioral problems (Depression), sleep disturbance and suicidal ideas. The patient is not nervous/anxious.     Physical Exam Vitals reviewed.  Constitutional:      Appearance: Normal appearance. He is obese. He is not ill-appearing.  HENT:     Head: Normocephalic and atraumatic.  Eyes:  Pupils: Pupils are equal, round, and reactive to light.  Cardiovascular:     Rate and Rhythm: Normal rate and regular rhythm.  Pulmonary:     Effort: Pulmonary effort is normal. No respiratory distress.  Skin:    Findings: Rash (on back) present. Rash is macular, papular and scaling.  Neurological:     Mental Status: He is alert and oriented to person, place, and time.  Psychiatric:        Mood and Affect: Mood normal.        Behavior: Behavior normal.       Assessment/Plan: 1. Allergic contact  dermatitis, unspecified trigger Clobetasol cream prescribed to treat rash and hydroxyzine prescribed for itching - clobetasol cream (TEMOVATE) 0.05 %; Apply 1 Application topically 2 (two) times daily. To affected area until resolved.  Dispense: 45 g; Refill: 1 - hydrOXYzine (ATARAX) 25 MG tablet; Take 0.5-1 tablets (12.5-25 mg total) by mouth 3 (three) times daily as needed for itching.  Dispense: 60 tablet; Refill: 1  2. Pruritus Hydroxyzine prescribed for itching.  - clobetasol cream (TEMOVATE) 0.05 %; Apply 1 Application topically 2 (two) times daily. To affected area until resolved.  Dispense: 45 g; Refill: 1 - hydrOXYzine (ATARAX) 25 MG tablet; Take 0.5-1 tablets (12.5-25 mg total) by mouth 3 (three) times daily as needed for itching.  Dispense: 60 tablet; Refill: 1   General Counseling: Aydian verbalizes understanding of the findings of todays visit and agrees with plan of treatment. I have discussed any further diagnostic evaluation that may be needed or ordered today. We also reviewed his medications today. he has been encouraged to call the office with any questions or concerns that should arise related to todays visit.    Counseling:    No orders of the defined types were placed in this encounter.   Meds ordered this encounter  Medications   clobetasol cream (TEMOVATE) 0.05 %    Sig: Apply 1 Application topically 2 (two) times daily. To affected area until resolved.    Dispense:  45 g    Refill:  1   hydrOXYzine (ATARAX) 25 MG tablet    Sig: Take 0.5-1 tablets (12.5-25 mg total) by mouth 3 (three) times daily as needed for itching.    Dispense:  60 tablet    Refill:  1    Return if symptoms worsen or fail to improve.  [place] Controlled Substance Database was reviewed by me for overdose risk score (ORS)  Time spent:20 Minutes Time spent with patient included reviewing progress notes, labs, imaging studies, and discussing plan for follow up.   This patient was seen  by Jonetta Osgood, FNP-C in collaboration with Dr. Clayborn Bigness as a part of collaborative care agreement.  Kala Ambriz R. Valetta Fuller, MSN, FNP-C Internal Medicine

## 2022-08-13 ENCOUNTER — Other Ambulatory Visit (HOSPITAL_COMMUNITY): Payer: Self-pay

## 2022-08-15 ENCOUNTER — Other Ambulatory Visit: Payer: Self-pay

## 2022-08-17 ENCOUNTER — Encounter: Payer: Self-pay | Admitting: Nurse Practitioner

## 2022-08-19 ENCOUNTER — Other Ambulatory Visit (HOSPITAL_COMMUNITY): Payer: Self-pay

## 2022-08-19 ENCOUNTER — Other Ambulatory Visit: Payer: Self-pay

## 2022-09-12 ENCOUNTER — Encounter: Payer: Self-pay | Admitting: Nurse Practitioner

## 2022-09-12 ENCOUNTER — Other Ambulatory Visit: Payer: Self-pay

## 2022-09-12 ENCOUNTER — Ambulatory Visit: Payer: Commercial Managed Care - PPO | Admitting: Nurse Practitioner

## 2022-09-12 VITALS — BP 137/83 | HR 80 | Temp 97.9°F | Resp 16 | Ht 65.0 in | Wt 179.8 lb

## 2022-09-12 DIAGNOSIS — R030 Elevated blood-pressure reading, without diagnosis of hypertension: Secondary | ICD-10-CM | POA: Diagnosis not present

## 2022-09-12 DIAGNOSIS — K219 Gastro-esophageal reflux disease without esophagitis: Secondary | ICD-10-CM | POA: Diagnosis not present

## 2022-09-12 DIAGNOSIS — E1165 Type 2 diabetes mellitus with hyperglycemia: Secondary | ICD-10-CM

## 2022-09-12 LAB — POCT GLYCOSYLATED HEMOGLOBIN (HGB A1C): Hemoglobin A1C: 7.1 % — AB (ref 4.0–5.6)

## 2022-09-12 MED ORDER — TIRZEPATIDE 12.5 MG/0.5ML ~~LOC~~ SOAJ
12.5000 mg | SUBCUTANEOUS | 2 refills | Status: DC
  Filled 2022-09-12: qty 2, 28d supply, fill #0
  Filled 2022-10-21: qty 2, 28d supply, fill #1
  Filled 2022-12-09: qty 2, 28d supply, fill #2

## 2022-09-12 MED ORDER — METFORMIN HCL ER 500 MG PO TB24
1000.0000 mg | ORAL_TABLET | Freq: Every day | ORAL | 1 refills | Status: DC
  Filled 2022-09-12: qty 180, 90d supply, fill #0
  Filled 2022-12-09: qty 180, 90d supply, fill #1

## 2022-09-12 MED ORDER — DAPAGLIFLOZIN PROPANEDIOL 10 MG PO TABS
10.0000 mg | ORAL_TABLET | Freq: Every day | ORAL | 1 refills | Status: DC
  Filled 2022-09-12: qty 90, 90d supply, fill #0
  Filled 2022-12-09: qty 30, 30d supply, fill #1

## 2022-09-12 NOTE — Progress Notes (Signed)
[facility] [place],  [id number]  Internal MEDICINE  Office Visit Note  Patient Name: Austin Hardin  [id number]  [id number]  Date of Service: 09/12/2022  Chief Complaint  Patient presents with   Diabetes   Follow-up    Diabetes Pertinent negatives for hypoglycemia include no nervousness/anxiousness or tremors. Pertinent negatives for diabetes include no chest pain and no fatigue.   Austin Hardin presents for a follow-up visit for diabetes Diabetes -- A1c is now 7.1, increased by 0.1. currently taking mounjaro 10 mg weekly, metformin xr 1000 mg daily, and farxiga 10 mg daily. No significant change in diet. No significant weight gain, actually a loss of 3 lb. Elevated BP at previous visit, BP is ok today. No issues.  GERD-- takes omeprazole as needed, still has several refills, has not been bothering him very much    Current Medication: Outpatient Encounter Medications as of 09/12/2022  Medication Sig   aspirin 81 MG tablet Take 81 mg by mouth daily.   azelastine (ASTELIN) 0.1 % nasal spray PLACE 2 SPRAYS INTO BOTH NOSTRILS TWO TIMES DAILY AS DIRECTED   clobetasol cream (TEMOVATE) [id number] % Apply 1 Application topically 2 (two) times daily. To affected area until resolved.   clotrimazole-betamethasone (LOTRISONE) cream Apply 1 application topically 2 (two) times daily.   fluticasone (FLONASE) 50 MCG/ACT nasal spray Use 2 sprays each nostril once daily   glucose monitoring kit (FREESTYLE) monitoring kit 1 each by Does not apply route in the morning and at bedtime. Dx e11.65   hydrOXYzine (ATARAX) 25 MG tablet Take 0.5-1 tablets (12.5-25 mg total) by mouth 3 (three) times daily as needed for itching.   IRON PO Take 65 mg by mouth.   nystatin ointment (MYCOSTATIN) Apply 1 application topically 2 (two) times daily.   omeprazole (PRILOSEC) 40 MG capsule TAKE 1 CAPSULE BY MOUTH AS NEEDED   tirzepatide (MOUNJARO) 12.5 MG/0.5ML Pen Inject 12.5 mg into the skin  once a week.   [DISCONTINUED] dapagliflozin propanediol (FARXIGA) 10 MG TABS tablet Take 1 tablet (10 mg total) by mouth daily.   [DISCONTINUED] metFORMIN (GLUCOPHAGE-XR) 500 MG 24 hr tablet Take 2 tablets (1,000 mg total) by mouth daily with breakfast. May take 1 tablet in the morning and 1 tablet in the evening if desired.   [DISCONTINUED] tirzepatide (MOUNJARO) 10 MG/0.5ML Pen Inject 10 mg into the skin once a week.   dapagliflozin propanediol (FARXIGA) 10 MG TABS tablet Take 1 tablet (10 mg total) by mouth daily.   metFORMIN (GLUCOPHAGE-XR) 500 MG 24 hr tablet Take 2 tablets (1,000 mg total) by mouth daily with breakfast. May take 1 tablet in the morning and 1 tablet in the evening if desired.   No facility-administered encounter medications on file as of 09/12/2022.    Surgical History: Past Surgical History:  Procedure Laterality Date   HERNIA REPAIR  1991    Medical History: Past Medical History:  Diagnosis Date   Allergy    environmental   Diabetes mellitus without complication     Family History: Family History  Problem Relation Age of Onset   Diabetes Mother    Hyperlipidemia Mother    Hypertension Mother    Cancer Father    Diabetes Father    Hyperlipidemia Father    Hypertension Father    Heart disease Father    Diabetes Brother    Diabetes Brother     Social History   Socioeconomic History   Marital status: Married    Spouse name:  Not on file   Number of children: Not on file   Years of education: Not on file   Highest education level: Not on file  Occupational History   Not on file  Tobacco Use   Smoking status: Never   Smokeless tobacco: Never  Vaping Use   Vaping Use: Never used  Substance and Sexual Activity   Alcohol use: No    Alcohol/week: 0.0 standard drinks of alcohol   Drug use: No   Sexual activity: Not on file  Other Topics Concern   Not on file  Social History Narrative   Not on file   Social Determinants of Health   Financial  Resource Strain: Not on file  Food Insecurity: Not on file  Transportation Needs: Not on file  Physical Activity: Not on file  Stress: Not on file  Social Connections: Not on file  Intimate Partner Violence: Not on file      Review of Systems  Constitutional:  Negative for chills, fatigue and unexpected weight change.  HENT:  Negative for congestion, rhinorrhea, sneezing and sore throat.   Eyes:  Negative for redness.  Respiratory: Negative.  Negative for cough, chest tightness and shortness of breath.   Cardiovascular: Negative.  Negative for chest pain and palpitations.  Gastrointestinal:  Negative for abdominal pain, constipation, diarrhea, nausea and vomiting.  Genitourinary:  Negative for dysuria and frequency.  Musculoskeletal:  Negative for arthralgias, back pain, joint swelling and neck pain.  Skin:  Negative for rash.  Neurological: Negative.  Negative for tremors and numbness.  Hematological:  Negative for adenopathy. Does not bruise/bleed easily.  Psychiatric/Behavioral:  Negative for behavioral problems (Depression), sleep disturbance and suicidal ideas. The patient is not nervous/anxious.     Vital Signs: BP 137/83   Pulse 80   Temp 97.9 F (36.6 C)   Resp 16   Ht 5\' 5"  (1.651 m)   Wt 179 lb 12.8 oz (81.6 kg)   SpO2 98%   BMI 29.92 kg/m    Physical Exam Vitals reviewed.  Constitutional:      Appearance: Normal appearance. He is obese. He is not ill-appearing.  HENT:     Head: Normocephalic and atraumatic.  Eyes:     Pupils: Pupils are equal, round, and reactive to light.  Cardiovascular:     Rate and Rhythm: Normal rate and regular rhythm.  Pulmonary:     Effort: Pulmonary effort is normal. No respiratory distress.  Neurological:     Mental Status: He is alert and oriented to person, place, and time.  Psychiatric:        Mood and Affect: Mood normal.        Behavior: Behavior normal.        Assessment/Plan: 1. Type 2 diabetes mellitus with  hyperglycemia, without long-term current use of insulin Continue metformin and farxiga as prescribed.  Mounjaro dose increased to 12.5 mg weekly. Follow up in 4 months for repeat A1c.  - POCT glycosylated hemoglobin (Hb A1C) - metFORMIN (GLUCOPHAGE-XR) 500 MG 24 hr tablet; Take 2 tablets (1,000 mg total) by mouth daily with breakfast. May take 1 tablet in the morning and 1 tablet in the evening if desired.  Dispense: 180 tablet; Refill: 1 - dapagliflozin propanediol (FARXIGA) 10 MG TABS tablet; Take 1 tablet (10 mg total) by mouth daily.  Dispense: 90 tablet; Refill: 1 - tirzepatide (MOUNJARO) 12.5 MG/0.5ML Pen; Inject 12.5 mg into the skin once a week.  Dispense: 6 mL; Refill: 2  2. Elevated blood-pressure  reading without diagnosis of hypertension resolved  3. Gastroesophageal reflux disease without esophagitis Stable, continue omeprazole prn    General Counseling: Austin Hardin verbalizes understanding of the findings of todays visit and agrees with plan of treatment. I have discussed any further diagnostic evaluation that may be needed or ordered today. We also reviewed his medications today. he has been encouraged to call the office with any questions or concerns that should arise related to todays visit.    Orders Placed This Encounter  Procedures   POCT glycosylated hemoglobin (Hb A1C)    Meds ordered this encounter  Medications   metFORMIN (GLUCOPHAGE-XR) 500 MG 24 hr tablet    Sig: Take 2 tablets (1,000 mg total) by mouth daily with breakfast. May take 1 tablet in the morning and 1 tablet in the evening if desired.    Dispense:  180 tablet    Refill:  1    For future refills   dapagliflozin propanediol (FARXIGA) 10 MG TABS tablet    Sig: Take 1 tablet (10 mg total) by mouth daily.    Dispense:  90 tablet    Refill:  1   tirzepatide (MOUNJARO) 12.5 MG/0.5ML Pen    Sig: Inject 12.5 mg into the skin once a week.    Dispense:  6 mL    Refill:  2    Note increased dose, please  discontinue 10 mg dose and fill new script asap    Return in about 4 months (around 01/12/2023) for F/U, Recheck A1C, Jesyca Weisenburger PCP.   Total time spent:30 Minutes Time spent includes review of chart, medications, test results, and follow up plan with the patient.   [place] Controlled Substance Database was reviewed by me.  This patient was seen by Jonetta Osgood, FNP-C in collaboration with Dr. Clayborn Bigness as a part of collaborative care agreement.   Gerrod Maule R. Valetta Fuller, MSN, FNP-C Internal medicine

## 2022-09-13 ENCOUNTER — Other Ambulatory Visit: Payer: Self-pay

## 2022-09-17 ENCOUNTER — Other Ambulatory Visit: Payer: Self-pay

## 2022-09-18 ENCOUNTER — Other Ambulatory Visit: Payer: Self-pay

## 2022-10-22 ENCOUNTER — Other Ambulatory Visit: Payer: Self-pay

## 2022-12-09 ENCOUNTER — Other Ambulatory Visit: Payer: Self-pay

## 2022-12-11 ENCOUNTER — Other Ambulatory Visit: Payer: Self-pay

## 2023-01-16 ENCOUNTER — Encounter: Payer: Self-pay | Admitting: Nurse Practitioner

## 2023-01-16 ENCOUNTER — Other Ambulatory Visit: Payer: Self-pay

## 2023-01-16 ENCOUNTER — Ambulatory Visit (INDEPENDENT_AMBULATORY_CARE_PROVIDER_SITE_OTHER): Payer: Commercial Managed Care - PPO | Admitting: Nurse Practitioner

## 2023-01-16 VITALS — BP 138/80 | HR 81 | Temp 98.4°F | Resp 16 | Ht 65.0 in | Wt 180.6 lb

## 2023-01-16 DIAGNOSIS — R030 Elevated blood-pressure reading, without diagnosis of hypertension: Secondary | ICD-10-CM

## 2023-01-16 DIAGNOSIS — K219 Gastro-esophageal reflux disease without esophagitis: Secondary | ICD-10-CM | POA: Diagnosis not present

## 2023-01-16 DIAGNOSIS — E1165 Type 2 diabetes mellitus with hyperglycemia: Secondary | ICD-10-CM

## 2023-01-16 LAB — POCT GLYCOSYLATED HEMOGLOBIN (HGB A1C): Hemoglobin A1C: 6.9 % — AB (ref 4.0–5.6)

## 2023-01-16 MED ORDER — OMEPRAZOLE 40 MG PO CPDR
40.0000 mg | DELAYED_RELEASE_CAPSULE | ORAL | 5 refills | Status: AC | PRN
  Filled 2023-01-16: qty 90, 90d supply, fill #0

## 2023-01-16 MED ORDER — DAPAGLIFLOZIN PROPANEDIOL 10 MG PO TABS
10.0000 mg | ORAL_TABLET | Freq: Every day | ORAL | 5 refills | Status: DC
  Filled 2023-01-16: qty 90, 90d supply, fill #0
  Filled 2023-01-20: qty 30, 30d supply, fill #0
  Filled 2023-02-18: qty 30, 30d supply, fill #1
  Filled 2023-03-23: qty 30, 30d supply, fill #2
  Filled 2023-04-11 – 2023-04-17 (×2): qty 90, 90d supply, fill #3

## 2023-01-16 MED ORDER — TIRZEPATIDE 12.5 MG/0.5ML ~~LOC~~ SOAJ
12.5000 mg | SUBCUTANEOUS | 5 refills | Status: DC
  Filled 2023-01-16: qty 2, 28d supply, fill #0
  Filled 2023-02-18: qty 2, 28d supply, fill #1
  Filled 2023-03-24: qty 2, 28d supply, fill #2
  Filled 2023-04-11 – 2023-04-17 (×2): qty 6, 84d supply, fill #3

## 2023-01-16 NOTE — Progress Notes (Signed)
[facility] [address] [place], [place] [address]  Internal MEDICINE  Office Visit Note  Patient Name: Austin Hardin  [id number]  [id number]  Date of Service: 01/16/2023  Chief Complaint  Patient presents with   Diabetes   Follow-up    HPI Austin Hardin presents for a follow-up visit for diabetes, GERD and elevated BP.  Diabetes -- A1c improving to 6.9. doing well.  GERD -- taking omeprazole Elevated blood pressure -- improved when rechecked   Current Medication: Outpatient Encounter Medications as of 01/16/2023  Medication Sig   aspirin 81 MG tablet Take 81 mg by mouth daily.   clobetasol cream (TEMOVATE) 0.05 % Apply 1 Application topically 2 (two) times daily. To affected area until resolved.   clotrimazole-betamethasone (LOTRISONE) cream Apply 1 application topically 2 (two) times daily.   fluticasone (FLONASE) 50 MCG/ACT nasal spray Use 2 sprays each nostril once daily   glucose monitoring kit (FREESTYLE) monitoring kit 1 each by Does not apply route in the morning and at bedtime. Dx e11.65   IRON PO Take 65 mg by mouth.   metFORMIN (GLUCOPHAGE-XR) 500 MG 24 hr tablet Take 2 tablets (1,000 mg total) by mouth daily with breakfast. May take 1 tablet in the morning and 1 tablet in the evening if desired.   nystatin ointment (MYCOSTATIN) Apply 1 application topically 2 (two) times daily.   [DISCONTINUED] dapagliflozin propanediol (FARXIGA) 10 MG TABS tablet Take 1 tablet (10 mg total) by mouth daily.   [DISCONTINUED] hydrOXYzine (ATARAX) 25 MG tablet Take 0.5-1 tablets (12.5-25 mg total) by mouth 3 (three) times daily as needed for itching.   [DISCONTINUED] tirzepatide (MOUNJARO) 12.5 MG/0.5ML Pen Inject 12.5 mg into the skin once a week.   azelastine (ASTELIN) 0.1 % nasal spray PLACE 2 SPRAYS INTO BOTH NOSTRILS TWO TIMES DAILY AS DIRECTED   dapagliflozin propanediol (FARXIGA) 10 MG TABS tablet Take 1 tablet (10 mg total) by mouth daily.   omeprazole (PRILOSEC) 40  MG capsule TAKE 1 CAPSULE BY MOUTH AS NEEDED   tirzepatide (MOUNJARO) 12.5 MG/0.5ML Pen Inject 12.5 mg into the skin once a week.   [DISCONTINUED] omeprazole (PRILOSEC) 40 MG capsule TAKE 1 CAPSULE BY MOUTH AS NEEDED   No facility-administered encounter medications on file as of 01/16/2023.    Surgical History: Past Surgical History:  Procedure Laterality Date   HERNIA REPAIR  1991    Medical History: Past Medical History:  Diagnosis Date   Allergy    environmental   Diabetes mellitus without complication (HCC)     Family History: Family History  Problem Relation Age of Onset   Diabetes Mother    Hyperlipidemia Mother    Hypertension Mother    Cancer Father    Diabetes Father    Hyperlipidemia Father    Hypertension Father    Heart disease Father    Diabetes Brother    Diabetes Brother     Social History   Socioeconomic History   Marital status: Married    Spouse name: Not on file   Number of children: Not on file   Years of education: Not on file   Highest education level: Not on file  Occupational History   Not on file  Tobacco Use   Smoking status: Never   Smokeless tobacco: Never  Vaping Use   Vaping status: Never Used  Substance and Sexual Activity   Alcohol use: No    Alcohol/week: 0.0 standard drinks of alcohol   Drug use: No   Sexual activity:  Not on file  Other Topics Concern   Not on file  Social History Narrative   Not on file   Social Determinants of Health   Financial Resource Strain: Not on file  Food Insecurity: Not on file  Transportation Needs: Not on file  Physical Activity: Not on file  Stress: Not on file  Social Connections: Not on file  Intimate Partner Violence: Not on file      Review of Systems  Constitutional:  Negative for chills, fatigue and unexpected weight change.  HENT:  Negative for congestion, rhinorrhea, sneezing and sore throat.   Eyes:  Negative for redness.  Respiratory: Negative.  Negative for cough,  chest tightness and shortness of breath.   Cardiovascular: Negative.  Negative for chest pain and palpitations.  Gastrointestinal:  Negative for abdominal pain, constipation, diarrhea, nausea and vomiting.  Genitourinary:  Negative for dysuria and frequency.  Musculoskeletal:  Negative for arthralgias, back pain, joint swelling and neck pain.  Skin:  Negative for rash.  Neurological: Negative.  Negative for tremors and numbness.  Hematological:  Negative for adenopathy. Does not bruise/bleed easily.  Psychiatric/Behavioral:  Negative for behavioral problems (Depression), sleep disturbance and suicidal ideas. The patient is not nervous/anxious.     Vital Signs: BP 138/80 Comment: 160/86  Pulse 81   Temp 98.4 F (36.9 C)   Resp 16   Ht 5\' 5"  (1.651 m)   Wt 180 lb 9.6 oz (81.9 kg)   SpO2 96%   BMI 30.05 kg/m    Physical Exam Vitals reviewed.  Constitutional:      Appearance: Normal appearance. He is obese. He is not ill-appearing.  HENT:     Head: Normocephalic and atraumatic.  Eyes:     Pupils: Pupils are equal, round, and reactive to light.  Cardiovascular:     Rate and Rhythm: Normal rate and regular rhythm.  Pulmonary:     Effort: Pulmonary effort is normal. No respiratory distress.  Neurological:     Mental Status: He is alert and oriented to person, place, and time.  Psychiatric:        Mood and Affect: Mood normal.        Behavior: Behavior normal.        Assessment/Plan: 1. Type 2 diabetes mellitus with hyperglycemia, without long-term current use of insulin (HCC) A1c continues to improve, down to 6.9. continue mounjaro and farxiga as prescribed. Refills ordered  - POCT glycosylated hemoglobin (Hb A1C) - dapagliflozin propanediol (FARXIGA) 10 MG TABS tablet; Take 1 tablet (10 mg total) by mouth daily.  Dispense: 30 tablet; Refill: 5 - tirzepatide (MOUNJARO) 12.5 MG/0.5ML Pen; Inject 12.5 mg into the skin once a week.  Dispense: 2 mL; Refill: 5  2.  Gastroesophageal reflux disease without esophagitis Controlled, continue omeprazole as prescribed. Refills ordered  - omeprazole (PRILOSEC) 40 MG capsule; Take 1 capsule (40 mg total) by mouth as needed.  Dispense: 30 capsule; Refill: 5  3. Elevated blood-pressure reading without diagnosis of hypertension Stable BP, no medications, continue to monitor.    General Counseling: Newel verbalizes understanding of the findings of todays visit and agrees with plan of treatment. I have discussed any further diagnostic evaluation that may be needed or ordered today. We also reviewed his medications today. he has been encouraged to call the office with any questions or concerns that should arise related to todays visit.    Orders Placed This Encounter  Procedures   POCT glycosylated hemoglobin (Hb A1C)    Meds ordered  this encounter  Medications   omeprazole (PRILOSEC) 40 MG capsule    Sig: TAKE 1 CAPSULE BY MOUTH AS NEEDED    Dispense:  30 capsule    Refill:  5   dapagliflozin propanediol (FARXIGA) 10 MG TABS tablet    Sig: Take 1 tablet (10 mg total) by mouth daily.    Dispense:  30 tablet    Refill:  5   tirzepatide (MOUNJARO) 12.5 MG/0.5ML Pen    Sig: Inject 12.5 mg into the skin once a week.    Dispense:  2 mL    Refill:  5    Return for upcoming physical in october, may need to reschedule, whatever he needs is fine. .   Total time spent:30 Minutes Time spent includes review of chart, medications, test results, and follow up plan with the patient.   [place] Controlled Substance Database was reviewed by me.  This patient was seen by Sallyanne Kuster, FNP-C in collaboration with Dr. Beverely Risen as a part of collaborative care agreement.   Pansy Ostrovsky R. Tedd Sias, MSN, FNP-C Internal medicine

## 2023-01-17 ENCOUNTER — Other Ambulatory Visit: Payer: Self-pay

## 2023-01-20 ENCOUNTER — Other Ambulatory Visit: Payer: Self-pay

## 2023-01-22 ENCOUNTER — Other Ambulatory Visit: Payer: Self-pay

## 2023-01-23 ENCOUNTER — Other Ambulatory Visit: Payer: Self-pay

## 2023-01-29 ENCOUNTER — Other Ambulatory Visit: Payer: Self-pay

## 2023-02-01 ENCOUNTER — Encounter: Payer: Self-pay | Admitting: Nurse Practitioner

## 2023-02-18 ENCOUNTER — Other Ambulatory Visit: Payer: Self-pay

## 2023-03-18 ENCOUNTER — Encounter: Payer: Commercial Managed Care - PPO | Admitting: Nurse Practitioner

## 2023-03-23 ENCOUNTER — Other Ambulatory Visit: Payer: Self-pay | Admitting: Nurse Practitioner

## 2023-03-23 ENCOUNTER — Other Ambulatory Visit: Payer: Self-pay

## 2023-03-23 DIAGNOSIS — E1165 Type 2 diabetes mellitus with hyperglycemia: Secondary | ICD-10-CM

## 2023-03-24 ENCOUNTER — Other Ambulatory Visit: Payer: Self-pay

## 2023-03-24 MED FILL — Metformin HCl Tab ER 24HR 500 MG: ORAL | 90 days supply | Qty: 180 | Fill #0 | Status: AC

## 2023-03-25 DIAGNOSIS — E119 Type 2 diabetes mellitus without complications: Secondary | ICD-10-CM | POA: Diagnosis not present

## 2023-03-25 DIAGNOSIS — H5213 Myopia, bilateral: Secondary | ICD-10-CM | POA: Diagnosis not present

## 2023-03-26 ENCOUNTER — Other Ambulatory Visit: Payer: Self-pay

## 2023-03-27 ENCOUNTER — Other Ambulatory Visit: Payer: Self-pay

## 2023-04-11 ENCOUNTER — Other Ambulatory Visit: Payer: Self-pay

## 2023-04-11 ENCOUNTER — Other Ambulatory Visit: Payer: Self-pay | Admitting: Nurse Practitioner

## 2023-04-11 MED ORDER — FREESTYLE LITE TEST VI STRP
ORAL_STRIP | 1 refills | Status: AC
  Filled 2023-04-11: qty 200, 90d supply, fill #0

## 2023-04-17 ENCOUNTER — Other Ambulatory Visit: Payer: Self-pay

## 2023-05-26 ENCOUNTER — Encounter: Payer: Commercial Managed Care - PPO | Admitting: Nurse Practitioner

## 2023-05-28 ENCOUNTER — Encounter: Payer: Self-pay | Admitting: Nurse Practitioner

## 2023-05-28 ENCOUNTER — Ambulatory Visit (INDEPENDENT_AMBULATORY_CARE_PROVIDER_SITE_OTHER): Payer: Commercial Managed Care - PPO | Admitting: Nurse Practitioner

## 2023-05-28 VITALS — BP 134/88 | HR 89 | Temp 98.7°F | Resp 16 | Ht 65.0 in | Wt 175.6 lb

## 2023-05-28 DIAGNOSIS — E1165 Type 2 diabetes mellitus with hyperglycemia: Secondary | ICD-10-CM

## 2023-05-28 DIAGNOSIS — Z6829 Body mass index (BMI) 29.0-29.9, adult: Secondary | ICD-10-CM | POA: Diagnosis not present

## 2023-05-28 DIAGNOSIS — Z0001 Encounter for general adult medical examination with abnormal findings: Secondary | ICD-10-CM | POA: Diagnosis not present

## 2023-05-28 DIAGNOSIS — K219 Gastro-esophageal reflux disease without esophagitis: Secondary | ICD-10-CM | POA: Diagnosis not present

## 2023-05-28 DIAGNOSIS — E663 Overweight: Secondary | ICD-10-CM

## 2023-05-28 NOTE — Progress Notes (Signed)
[facility] [address] [place], [place] [address]  Internal MEDICINE  Office Visit Note  Patient Name: Austin Hardin  [id number]  [id number]  Date of Service: 05/28/2023  Chief Complaint  Patient presents with   Diabetes   Annual Exam    HPI Austin Hardin presents for an annual well visit and physical exam.  Well-appearing 57 y.o. male with type 2 diabetes, GERD, and high cholesterol.  Due for routine colonoscopy next year.  Eye exam: has seen his eye doctor this year in August.   foot exam: done today.  Labs: due for routine labs  New or worsening pain: none  Other concerns: none  Lost 5 lbs since last office visit.  Recently visited [place] with family.     Current Medication: Outpatient Encounter Medications as of 05/28/2023  Medication Sig   aspirin 81 MG tablet Take 81 mg by mouth daily.   clobetasol cream (TEMOVATE) 0.05 % Apply 1 Application topically 2 (two) times daily. To affected area until resolved.   clotrimazole-betamethasone (LOTRISONE) cream Apply 1 application topically 2 (two) times daily.   dapagliflozin propanediol (FARXIGA) 10 MG TABS tablet Take 1 tablet (10 mg total) by mouth daily.   fluticasone (FLONASE) 50 MCG/ACT nasal spray Use 2 sprays each nostril once daily   glucose blood (FREESTYLE LITE) test strip USE AS INSTRUCTED TWICE A DAILY   glucose monitoring kit (FREESTYLE) monitoring kit 1 each by Does not apply route in the morning and at bedtime. Dx e11.65   IRON PO Take 65 mg by mouth.   metFORMIN (GLUCOPHAGE-XR) 500 MG 24 hr tablet Take 2 tablets (1,000 mg total) by mouth daily with breakfast. May take 1 tablet in the morning and 1 tablet in the evening if desired.   nystatin ointment (MYCOSTATIN) Apply 1 application topically 2 (two) times daily.   omeprazole (PRILOSEC) 40 MG capsule Take 1 capsule (40 mg total) by mouth as needed.   tirzepatide (MOUNJARO) 12.5 MG/0.5ML Pen Inject 12.5 mg into the skin once a week.   azelastine  (ASTELIN) 0.1 % nasal spray PLACE 2 SPRAYS INTO BOTH NOSTRILS TWO TIMES DAILY AS DIRECTED   No facility-administered encounter medications on file as of 05/28/2023.    Surgical History: Past Surgical History:  Procedure Laterality Date   HERNIA REPAIR  1991    Medical History: Past Medical History:  Diagnosis Date   Allergy    environmental   Diabetes mellitus without complication (HCC)     Family History: Family History  Problem Relation Age of Onset   Diabetes Mother    Hyperlipidemia Mother    Hypertension Mother    Cancer Father    Diabetes Father    Hyperlipidemia Father    Hypertension Father    Heart disease Father    Diabetes Brother    Diabetes Brother     Social History   Socioeconomic History   Marital status: Married    Spouse name: Not on file   Number of children: Not on file   Years of education: Not on file   Highest education level: Not on file  Occupational History   Not on file  Tobacco Use   Smoking status: Never   Smokeless tobacco: Never  Vaping Use   Vaping status: Never Used  Substance and Sexual Activity   Alcohol use: No    Alcohol/week: 0.0 standard drinks of alcohol   Drug use: No   Sexual activity: Not on file  Other Topics Concern   Not  on file  Social History Narrative   Not on file   Social Drivers of Health   Financial Resource Strain: Not on file  Food Insecurity: Not on file  Transportation Needs: Not on file  Physical Activity: Not on file  Stress: Not on file  Social Connections: Not on file  Intimate Partner Violence: Not on file      Review of Systems  Constitutional:  Negative for activity change, appetite change, chills, fatigue, fever and unexpected weight change.  HENT: Negative.  Negative for congestion, ear pain, rhinorrhea, sore throat and trouble swallowing.   Eyes: Negative.   Respiratory: Negative.  Negative for cough, chest tightness, shortness of breath and wheezing.   Cardiovascular:  Negative.  Negative for chest pain.  Gastrointestinal: Negative.  Negative for abdominal pain, blood in stool, constipation, diarrhea, nausea and vomiting.  Endocrine: Negative.   Genitourinary: Negative.  Negative for difficulty urinating, dysuria, frequency, hematuria and urgency.  Musculoskeletal: Negative.  Negative for arthralgias, back pain, joint swelling, myalgias and neck pain.  Skin: Negative.  Negative for rash and wound.  Allergic/Immunologic: Negative.  Negative for immunocompromised state.  Neurological: Negative.  Negative for dizziness, seizures, numbness and headaches.  Hematological: Negative.   Psychiatric/Behavioral: Negative.  Negative for behavioral problems, self-injury and suicidal ideas. The patient is not nervous/anxious.     Vital Signs: BP 134/88   Pulse 89   Temp 98.7 F (37.1 C)   Resp 16   Ht 5\' 5"  (1.651 m)   Wt 175 lb 9.6 oz (79.7 kg)   SpO2 99%   BMI 29.22 kg/m    Physical Exam Vitals reviewed.  Constitutional:      General: He is awake. He is not in acute distress.    Appearance: Normal appearance. He is well-developed and well-groomed. He is obese. He is not ill-appearing or diaphoretic.  HENT:     Head: Normocephalic and atraumatic.     Right Ear: Tympanic membrane, ear canal and external ear normal.     Left Ear: Tympanic membrane, ear canal and external ear normal.     Nose: Nose normal. No congestion or rhinorrhea.     Mouth/Throat:     Lips: Pink.     Mouth: Mucous membranes are moist.     Pharynx: Oropharynx is clear. Uvula midline. No oropharyngeal exudate or posterior oropharyngeal erythema.  Eyes:     General: Lids are normal. Vision grossly intact. Gaze aligned appropriately. No scleral icterus.       Right eye: No discharge.        Left eye: No discharge.     Extraocular Movements: Extraocular movements intact.     Conjunctiva/sclera: Conjunctivae normal.     Pupils: Pupils are equal, round, and reactive to light.      Funduscopic exam:    Right eye: Red reflex present.        Left eye: Red reflex present. Neck:     Thyroid: No thyromegaly.     Vascular: No JVD.     Trachea: No tracheal deviation.  Cardiovascular:     Rate and Rhythm: Normal rate and regular rhythm.     Pulses: Normal pulses.          Dorsalis pedis pulses are 2+ on the right side and 2+ on the left side.       Posterior tibial pulses are 2+ on the right side and 2+ on the left side.     Heart sounds: Normal heart sounds, S1 normal  and S2 normal. No murmur heard.    No friction rub. No gallop.  Pulmonary:     Effort: Pulmonary effort is normal. No accessory muscle usage or respiratory distress.     Breath sounds: Normal breath sounds and air entry. No stridor. No wheezing or rales.  Chest:     Chest wall: No tenderness.  Abdominal:     General: Bowel sounds are normal. There is no distension.     Palpations: Abdomen is soft. There is no shifting dullness, fluid wave, mass or pulsatile mass.     Tenderness: There is no abdominal tenderness. There is no guarding or rebound.  Musculoskeletal:        General: No tenderness or deformity. Normal range of motion.     Cervical back: Normal range of motion and neck supple.     Right lower leg: No edema.     Left lower leg: No edema.     Right foot: Normal range of motion. No deformity, bunion, Charcot foot, foot drop or prominent metatarsal heads.     Left foot: Normal range of motion. No deformity, bunion, Charcot foot, foot drop or prominent metatarsal heads.  Feet:     Right foot:     Protective Sensation: 6 sites tested.  6 sites sensed.     Skin integrity: Dry skin present. No ulcer, blister, skin breakdown, erythema, warmth, callus or fissure.     Toenail Condition: Right toenails are abnormally thick and long.     Left foot:     Protective Sensation: 6 sites tested.  6 sites sensed.     Skin integrity: Dry skin present. No ulcer, blister, skin breakdown, erythema, warmth, callus  or fissure.     Toenail Condition: Left toenails are abnormally thick and long.  Lymphadenopathy:     Cervical: No cervical adenopathy.  Skin:    General: Skin is warm and dry.     Capillary Refill: Capillary refill takes less than 2 seconds.     Coloration: Skin is not pale.     Findings: No erythema or rash.  Neurological:     Mental Status: He is alert and oriented to person, place, and time.     Cranial Nerves: No cranial nerve deficit.     Motor: No abnormal muscle tone.     Coordination: Coordination normal.     Deep Tendon Reflexes: Reflexes are normal and symmetric.  Psychiatric:        Mood and Affect: Mood and affect normal.        Behavior: Behavior normal. Behavior is cooperative.        Thought Content: Thought content normal.        Judgment: Judgment normal.        Assessment/Plan: 1. Encounter for routine adult health examination with abnormal findings (Primary) Age-appropriate preventive screenings and vaccinations discussed, annual physical exam completed. Routine labs for health maintenance will be ordered. PHM updated.    2. Type 2 diabetes mellitus with hyperglycemia, without long-term current use of insulin (HCC) Urine sent for microalbumin.creatinine ratio. Stable, continue farxiga, mounjaro and metformin as prescribed.  - Urine Microalbumin w/creat. ratio  3. Gastroesophageal reflux disease without esophagitis Continue omeprazole as prescribed.   4. Overweight with body mass index (BMI) of 29 to 29.9 in adult Lost 5 lbs since last office visit, continue mounjaro for his diabetes       General Counseling: Jaun verbalizes understanding of the findings of todays visit and agrees with plan of  treatment. I have discussed any further diagnostic evaluation that may be needed or ordered today. We also reviewed his medications today. he has been encouraged to call the office with any questions or concerns that should arise related to todays  visit.    Orders Placed This Encounter  Procedures   Urine Microalbumin w/creat. ratio    No orders of the defined types were placed in this encounter.   Return in about 4 months (around 09/26/2023) for F/U, Recheck A1C, Marrian Bells PCP.   Total time spent:30 Minutes Time spent includes review of chart, medications, test results, and follow up plan with the patient.   [place] Controlled Substance Database was reviewed by me.  This patient was seen by Sallyanne Kuster, FNP-C in collaboration with Dr. Beverely Risen as a part of collaborative care agreement.  Leonna Schlee R. Tedd Sias, MSN, FNP-C Internal medicine

## 2023-05-29 LAB — MICROALBUMIN / CREATININE URINE RATIO
Creatinine, Urine: 31.4 mg/dL
Microalb/Creat Ratio: <10 mg/g{creat} (ref 0–29)
Microalbumin, Urine: <3 ug/mL

## 2023-05-30 ENCOUNTER — Other Ambulatory Visit: Payer: Self-pay | Admitting: Nurse Practitioner

## 2023-05-30 DIAGNOSIS — K219 Gastro-esophageal reflux disease without esophagitis: Secondary | ICD-10-CM

## 2023-05-30 DIAGNOSIS — Z6829 Body mass index (BMI) 29.0-29.9, adult: Secondary | ICD-10-CM

## 2023-05-30 DIAGNOSIS — E782 Mixed hyperlipidemia: Secondary | ICD-10-CM

## 2023-05-30 DIAGNOSIS — E538 Deficiency of other specified B group vitamins: Secondary | ICD-10-CM

## 2023-05-30 DIAGNOSIS — E559 Vitamin D deficiency, unspecified: Secondary | ICD-10-CM

## 2023-05-30 DIAGNOSIS — E1165 Type 2 diabetes mellitus with hyperglycemia: Secondary | ICD-10-CM

## 2023-05-30 DIAGNOSIS — Z125 Encounter for screening for malignant neoplasm of prostate: Secondary | ICD-10-CM

## 2023-06-02 ENCOUNTER — Other Ambulatory Visit: Payer: Self-pay | Admitting: Nurse Practitioner

## 2023-06-02 DIAGNOSIS — E538 Deficiency of other specified B group vitamins: Secondary | ICD-10-CM

## 2023-06-02 DIAGNOSIS — E782 Mixed hyperlipidemia: Secondary | ICD-10-CM

## 2023-06-02 DIAGNOSIS — E559 Vitamin D deficiency, unspecified: Secondary | ICD-10-CM

## 2023-06-02 DIAGNOSIS — E663 Overweight: Secondary | ICD-10-CM

## 2023-06-02 DIAGNOSIS — Z125 Encounter for screening for malignant neoplasm of prostate: Secondary | ICD-10-CM

## 2023-06-02 DIAGNOSIS — E1165 Type 2 diabetes mellitus with hyperglycemia: Secondary | ICD-10-CM

## 2023-06-13 ENCOUNTER — Other Ambulatory Visit
Admission: RE | Admit: 2023-06-13 | Discharge: 2023-06-13 | Disposition: A | Discharge status: Home / Self Care | Payer: Commercial Managed Care - PPO | Source: Ambulatory Visit | Attending: Nurse Practitioner | Admitting: Nurse Practitioner

## 2023-06-13 DIAGNOSIS — Z6829 Body mass index (BMI) 29.0-29.9, adult: Secondary | ICD-10-CM | POA: Diagnosis not present

## 2023-06-13 DIAGNOSIS — E1165 Type 2 diabetes mellitus with hyperglycemia: Secondary | ICD-10-CM | POA: Insufficient documentation

## 2023-06-13 DIAGNOSIS — E663 Overweight: Secondary | ICD-10-CM | POA: Diagnosis not present

## 2023-06-13 LAB — COMPREHENSIVE METABOLIC PANEL
ALT: 25 U/L (ref 0–44)
AST: 23 U/L (ref 15–41)
Albumin: 4.3 g/dL (ref 3.5–5.0)
Alkaline Phosphatase: 63 U/L (ref 38–126)
Anion gap: 12 (ref 5–15)
BUN: 13 mg/dL (ref 6–20)
CO2: 25 mmol/L (ref 22–32)
Calcium: 9.1 mg/dL (ref 8.9–10.3)
Chloride: 100 mmol/L (ref 98–111)
Creatinine, Ser: 0.68 mg/dL (ref 0.61–1.24)
GFR, Estimated: >60 mL/min (ref >60)
Glucose, Bld: 117 mg/dL — ABNORMAL HIGH (ref 70–99)
Potassium: 3.9 mmol/L (ref 3.5–5.1)
Sodium: 137 mmol/L (ref 135–145)
Total Bilirubin: 1 mg/dL (ref 0.0–1.2)
Total Protein: 7.7 g/dL (ref 6.5–8.1)

## 2023-06-13 LAB — FOLATE: Folate: 11.7 ng/mL (ref >5.9)

## 2023-06-13 LAB — CBC WITH DIFFERENTIAL/PLATELET
Abs Immature Granulocytes: 0.01 10*3/uL (ref 0.00–0.07)
Basophils Absolute: 0.1 10*3/uL (ref 0.0–0.1)
Basophils Relative: 1 %
Eosinophils Absolute: 0.2 10*3/uL (ref 0.0–0.5)
Eosinophils Relative: 2 %
HCT: 51.2 % (ref 39.0–52.0)
Hemoglobin: 16 g/dL (ref 13.0–17.0)
Immature Granulocytes: 0 %
Lymphocytes Relative: 28 %
Lymphs Abs: 1.8 10*3/uL (ref 0.7–4.0)
MCH: 25.3 pg — ABNORMAL LOW (ref 26.0–34.0)
MCHC: 31.3 g/dL (ref 30.0–36.0)
MCV: 80.9 fL (ref 80.0–100.0)
Monocytes Absolute: 0.5 10*3/uL (ref 0.1–1.0)
Monocytes Relative: 8 %
Neutro Abs: 3.8 10*3/uL (ref 1.7–7.7)
Neutrophils Relative %: 61 %
Platelets: 316 10*3/uL (ref 150–400)
RBC: 6.33 MIL/uL — ABNORMAL HIGH (ref 4.22–5.81)
RDW: 13.4 % (ref 11.5–15.5)
WBC: 6.3 10*3/uL (ref 4.0–10.5)
nRBC: 0 % (ref 0.0–0.2)

## 2023-06-13 LAB — HEMOGLOBIN A1C
Hgb A1c MFr Bld: 6.7 % — ABNORMAL HIGH (ref 4.8–5.6)
Mean Plasma Glucose: 146 mg/dL

## 2023-06-13 LAB — VITAMIN D 25 HYDROXY (VIT D DEFICIENCY, FRACTURES): Vit D, 25-Hydroxy: 24.45 ng/mL — ABNORMAL LOW (ref 30–100)

## 2023-06-13 LAB — VITAMIN B12: Vitamin B-12: 212 pg/mL (ref 180–914)

## 2023-06-13 LAB — LIPID PANEL
Cholesterol: 125 mg/dL (ref 0–200)
HDL: 50 mg/dL (ref >40)
LDL Cholesterol: 67 mg/dL (ref 0–99)
Total CHOL/HDL Ratio: 2.5 {ratio}
Triglycerides: 42 mg/dL (ref <150)
VLDL: 8 mg/dL (ref 0–40)

## 2023-06-16 LAB — PSA (REFLEX TO FREE) (SERIAL): Prostate Specific Ag, Serum: 2 ng/mL (ref 0.0–4.0)

## 2023-07-15 ENCOUNTER — Other Ambulatory Visit: Payer: Self-pay | Admitting: Nurse Practitioner

## 2023-07-15 ENCOUNTER — Other Ambulatory Visit: Payer: Self-pay

## 2023-07-15 DIAGNOSIS — E1165 Type 2 diabetes mellitus with hyperglycemia: Secondary | ICD-10-CM

## 2023-07-15 MED ORDER — MOUNJARO 12.5 MG/0.5ML ~~LOC~~ SOAJ
12.5000 mg | SUBCUTANEOUS | 3 refills | Status: AC
  Filled 2023-07-15: qty 6, 84d supply, fill #0
  Filled 2023-10-12: qty 6, 84d supply, fill #1
  Filled 2023-12-29: qty 6, 84d supply, fill #2
  Filled 2024-03-25: qty 6, 84d supply, fill #3

## 2023-07-15 MED ORDER — DAPAGLIFLOZIN PROPANEDIOL 10 MG PO TABS
10.0000 mg | ORAL_TABLET | Freq: Every day | ORAL | 1 refills | Status: DC
  Filled 2023-07-15: qty 90, 90d supply, fill #0

## 2023-07-15 MED FILL — Metformin HCl Tab ER 24HR 500 MG: ORAL | 90 days supply | Qty: 180 | Fill #1 | Status: AC

## 2023-08-01 ENCOUNTER — Other Ambulatory Visit: Payer: Self-pay

## 2023-08-01 NOTE — Progress Notes (Signed)
 Needs add on, ferritin and iron, and folic acid, needs b12 inj

## 2023-08-04 ENCOUNTER — Telehealth: Payer: Self-pay

## 2023-08-04 ENCOUNTER — Other Ambulatory Visit: Payer: Self-pay

## 2023-08-04 MED ORDER — LUER LOCK SAFETY SYRINGES 25G X 5/8" 3 ML MISC
6 refills | Status: AC
  Filled 2023-08-04 – 2024-01-02 (×4): qty 1, fill #0

## 2023-08-04 MED ORDER — CYANOCOBALAMIN 1000 MCG/ML IJ SOLN
1000.0000 ug | INTRAMUSCULAR | 6 refills | Status: AC
  Filled 2023-08-04: qty 1, 30d supply, fill #0
  Filled 2023-10-12: qty 1, 30d supply, fill #1
  Filled 2023-12-29: qty 1, 30d supply, fill #2

## 2023-08-04 NOTE — Telephone Encounter (Signed)
-----   Message from [facility] sent at 08/01/2023 10:45 AM EST ----- Needs add on, ferritin and iron, and folic acid, needs b12 inj

## 2023-08-04 NOTE — Telephone Encounter (Signed)
 Pt advised that B12 is low need B12 injection every month send pres to phar and also we will add more labs at next visit

## 2023-09-29 ENCOUNTER — Encounter: Payer: Self-pay | Admitting: Nurse Practitioner

## 2023-09-29 ENCOUNTER — Ambulatory Visit (INDEPENDENT_AMBULATORY_CARE_PROVIDER_SITE_OTHER): Payer: Commercial Managed Care - PPO | Admitting: Nurse Practitioner

## 2023-09-29 ENCOUNTER — Other Ambulatory Visit: Payer: Self-pay

## 2023-09-29 VITALS — BP 130/86 | HR 84 | Temp 98.6°F | Resp 16 | Ht 65.0 in | Wt 165.6 lb

## 2023-09-29 DIAGNOSIS — E538 Deficiency of other specified B group vitamins: Secondary | ICD-10-CM | POA: Diagnosis not present

## 2023-09-29 DIAGNOSIS — E1165 Type 2 diabetes mellitus with hyperglycemia: Secondary | ICD-10-CM | POA: Diagnosis not present

## 2023-09-29 DIAGNOSIS — E559 Vitamin D deficiency, unspecified: Secondary | ICD-10-CM | POA: Diagnosis not present

## 2023-09-29 LAB — POCT GLYCOSYLATED HEMOGLOBIN (HGB A1C): Hemoglobin A1C: 6.2 % — AB (ref 4.0–5.6)

## 2023-09-29 MED ORDER — METFORMIN HCL ER 500 MG PO TB24
1000.0000 mg | ORAL_TABLET | Freq: Every day | ORAL | 1 refills | Status: DC
  Filled 2023-09-29 – 2023-10-12 (×2): qty 180, 90d supply, fill #0
  Filled 2024-01-02: qty 180, 90d supply, fill #1

## 2023-09-29 MED ORDER — DAPAGLIFLOZIN PROPANEDIOL 10 MG PO TABS
10.0000 mg | ORAL_TABLET | Freq: Every day | ORAL | 1 refills | Status: DC
  Filled 2023-09-29 – 2023-10-12 (×2): qty 90, 90d supply, fill #0
  Filled 2024-01-02: qty 90, 90d supply, fill #1

## 2023-09-29 NOTE — Progress Notes (Signed)
 [facility] [address] [place], [place] [address]  Internal MEDICINE  Office Visit Note  Patient Name: Austin Hardin  [id number]  [id number]  Date of Service: 09/29/2023  Chief Complaint  Patient presents with   Diabetes   Follow-up    HPI Austin Hardin presents for a follow-up visit for diabetes, low B12 and low vitamin D .  Diabetes -- A1c continues to improve to 6.2 today from 6.7 in January this year. Taking mounjaro , farxiga  and metformin .  Low B12 -- patient is getting monthly B12 injection done at home  Low vitamin D  -- taking OTC supplement.  Weight loss -- lost 10 more lbs since last office visit.    Current Medication: Outpatient Encounter Medications as of 09/29/2023  Medication Sig   aspirin 81 MG tablet Take 81 mg by mouth daily.   clobetasol  cream (TEMOVATE ) 0.05 % Apply 1 Application topically 2 (two) times daily. To affected area until resolved.   clotrimazole -betamethasone  (LOTRISONE ) cream Apply 1 application topically 2 (two) times daily.   cyanocobalamin  (VITAMIN B12) 1000 MCG/ML injection Inject 1 mL (1,000 mcg total) into the muscle every 30 (thirty) days.   fluticasone  (FLONASE ) 50 MCG/ACT nasal spray Use 2 sprays each nostril once daily   glucose blood (FREESTYLE LITE) test strip USE AS INSTRUCTED TWICE A DAILY   glucose monitoring kit (FREESTYLE) monitoring kit 1 each by Does not apply route in the morning and at bedtime. Dx e11.65   IRON PO Take 65 mg by mouth.   nystatin  ointment (MYCOSTATIN ) Apply 1 application topically 2 (two) times daily.   omeprazole  (PRILOSEC) 40 MG capsule Take 1 capsule (40 mg total) by mouth as needed.   SYRINGE-NEEDLE, DISP, 3 ML (LUER LOCK SAFETY SYRINGES) 25G X 5/8" 3 ML MISC Use as directed once a month for B12 injection   tirzepatide  (MOUNJARO ) 12.5 MG/0.5ML Pen Inject 12.5 mg into the skin once a week.   [DISCONTINUED] dapagliflozin  propanediol (FARXIGA ) 10 MG TABS tablet Take 1 tablet (10 mg total) by mouth  daily.   [DISCONTINUED] metFORMIN  (GLUCOPHAGE -XR) 500 MG 24 hr tablet Take 2 tablets (1,000 mg total) by mouth daily with breakfast. May take 1 tablet in the morning and 1 tablet in the evening if desired.   azelastine  (ASTELIN ) 0.1 % nasal spray PLACE 2 SPRAYS INTO BOTH NOSTRILS TWO TIMES DAILY AS DIRECTED   dapagliflozin  propanediol (FARXIGA ) 10 MG TABS tablet Take 1 tablet (10 mg total) by mouth daily.   metFORMIN  (GLUCOPHAGE -XR) 500 MG 24 hr tablet Take 2 tablets (1,000 mg total) by mouth daily with breakfast. May take 1 tablet in the morning and 1 tablet in the evening if desired.   No facility-administered encounter medications on file as of 09/29/2023.    Surgical History: Past Surgical History:  Procedure Laterality Date   HERNIA REPAIR  1991    Medical History: Past Medical History:  Diagnosis Date   Allergy    environmental   Diabetes mellitus without complication (HCC)     Family History: Family History  Problem Relation Age of Onset   Diabetes Mother    Hyperlipidemia Mother    Hypertension Mother    Cancer Father    Diabetes Father    Hyperlipidemia Father    Hypertension Father    Heart disease Father    Diabetes Brother    Diabetes Brother     Social History   Socioeconomic History   Marital status: Married    Spouse name: Not on file   Number  of children: Not on file   Years of education: Not on file   Highest education level: Not on file  Occupational History   Not on file  Tobacco Use   Smoking status: Never   Smokeless tobacco: Never  Vaping Use   Vaping status: Never Used  Substance and Sexual Activity   Alcohol use: No    Alcohol/week: 0.0 standard drinks of alcohol   Drug use: No   Sexual activity: Not on file  Other Topics Concern   Not on file  Social History Narrative   Not on file   Social Drivers of Health   Financial Resource Strain: Not on file  Food Insecurity: Not on file  Transportation Needs: Not on file  Physical  Activity: Not on file  Stress: Not on file  Social Connections: Not on file  Intimate Partner Violence: Not on file      Review of Systems  Constitutional:  Negative for chills, fatigue and unexpected weight change.  HENT:  Negative for congestion, rhinorrhea, sneezing and sore throat.   Eyes:  Negative for redness.  Respiratory: Negative.  Negative for cough, chest tightness and shortness of breath.   Cardiovascular: Negative.  Negative for chest pain and palpitations.  Gastrointestinal:  Negative for abdominal pain, constipation, diarrhea, nausea and vomiting.  Genitourinary:  Negative for dysuria and frequency.  Musculoskeletal:  Negative for arthralgias, back pain, joint swelling and neck pain.  Skin:  Negative for rash.  Neurological: Negative.  Negative for tremors and numbness.  Hematological:  Negative for adenopathy. Does not bruise/bleed easily.  Psychiatric/Behavioral:  Negative for behavioral problems (Depression), sleep disturbance and suicidal ideas. The patient is not nervous/anxious.     Vital Signs: BP 130/86   Pulse 84   Temp 98.6 F (37 C)   Resp 16   Ht 5\' 5"  (1.651 m)   Wt 165 lb 9.6 oz (75.1 kg)   SpO2 99%   BMI 27.56 kg/m    Physical Exam Vitals reviewed.  Constitutional:      Appearance: Normal appearance. He is obese. He is not ill-appearing.  HENT:     Head: Normocephalic and atraumatic.  Eyes:     Pupils: Pupils are equal, round, and reactive to light.  Cardiovascular:     Rate and Rhythm: Normal rate and regular rhythm.  Pulmonary:     Effort: Pulmonary effort is normal. No respiratory distress.  Skin:    Capillary Refill: Capillary refill takes less than 2 seconds.  Neurological:     Mental Status: He is alert and oriented to person, place, and time.  Psychiatric:        Mood and Affect: Mood normal.        Behavior: Behavior normal.        Assessment/Plan: 1. Type 2 diabetes mellitus with hyperglycemia, without long-term  current use of insulin  (HCC) (Primary) A1c has improved further to 6.2 which is stable and in target range. Continue metformin , farxiga  and mounjaro  as prescribed. He has also lost 10 more lbs since December 2024.  - POCT glycosylated hemoglobin (Hb A1C) - metFORMIN  (GLUCOPHAGE -XR) 500 MG 24 hr tablet; Take 2 tablets (1,000 mg total) by mouth daily with breakfast. May take 1 tablet in the morning and 1 tablet in the evening if desired.  Dispense: 180 tablet; Refill: 1 - dapagliflozin  propanediol (FARXIGA ) 10 MG TABS tablet; Take 1 tablet (10 mg total) by mouth daily.  Dispense: 90 tablet; Refill: 1  2. B12 deficiency Patient will continue  B12 injections monthly at home.   3. Vitamin D  deficiency Encourage patient to continue OTC vitamin D  supplement.    General Counseling: Drue verbalizes understanding of the findings of todays visit and agrees with plan of treatment. I have discussed any further diagnostic evaluation that may be needed or ordered today. We also reviewed his medications today. he has been encouraged to call the office with any questions or concerns that should arise related to todays visit.    Orders Placed This Encounter  Procedures   POCT glycosylated hemoglobin (Hb A1C)    Meds ordered this encounter  Medications   metFORMIN  (GLUCOPHAGE -XR) 500 MG 24 hr tablet    Sig: Take 2 tablets (1,000 mg total) by mouth daily with breakfast. May take 1 tablet in the morning and 1 tablet in the evening if desired.    Dispense:  180 tablet    Refill:  1    For future refills   dapagliflozin  propanediol (FARXIGA ) 10 MG TABS tablet    Sig: Take 1 tablet (10 mg total) by mouth daily.    Dispense:  90 tablet    Refill:  1    Return in about 4 months (around 01/29/2024) for F/U, Recheck A1C, Doreather Hoxworth PCP.   Total time spent:30 Minutes Time spent includes review of chart, medications, test results, and follow up plan with the patient.   [place] Controlled Substance Database was  reviewed by me.  This patient was seen by Laurence Pons, FNP-C in collaboration with Dr. Verneta Gone as a part of collaborative care agreement.   Daisja Kessinger R. Bobbi Burow, MSN, FNP-C Internal medicine

## 2023-10-09 ENCOUNTER — Other Ambulatory Visit: Payer: Self-pay

## 2023-10-12 ENCOUNTER — Other Ambulatory Visit: Payer: Self-pay

## 2023-10-13 ENCOUNTER — Other Ambulatory Visit: Payer: Self-pay

## 2023-10-26 ENCOUNTER — Encounter: Payer: Self-pay | Admitting: Nurse Practitioner

## 2023-12-29 ENCOUNTER — Other Ambulatory Visit: Payer: Self-pay

## 2023-12-29 ENCOUNTER — Other Ambulatory Visit: Payer: Self-pay | Admitting: Nurse Practitioner

## 2023-12-29 DIAGNOSIS — L299 Pruritus, unspecified: Secondary | ICD-10-CM

## 2023-12-29 DIAGNOSIS — L239 Allergic contact dermatitis, unspecified cause: Secondary | ICD-10-CM

## 2023-12-29 MED ORDER — CLOBETASOL PROPIONATE 0.05 % EX CREA
1.0000 | TOPICAL_CREAM | Freq: Two times a day (BID) | CUTANEOUS | 1 refills | Status: AC
  Filled 2023-12-29: qty 45, 45d supply, fill #0

## 2024-01-02 ENCOUNTER — Other Ambulatory Visit: Payer: Self-pay | Admitting: Nurse Practitioner

## 2024-01-02 ENCOUNTER — Other Ambulatory Visit: Payer: Self-pay

## 2024-01-02 DIAGNOSIS — B372 Candidiasis of skin and nail: Secondary | ICD-10-CM

## 2024-01-05 ENCOUNTER — Other Ambulatory Visit: Payer: Self-pay

## 2024-01-05 MED FILL — Clotrimazole w/ Betamethasone Cream 1-0.05%: CUTANEOUS | 30 days supply | Qty: 45 | Fill #0 | Status: AC

## 2024-02-02 ENCOUNTER — Encounter: Payer: Self-pay | Admitting: Nurse Practitioner

## 2024-02-02 ENCOUNTER — Ambulatory Visit (INDEPENDENT_AMBULATORY_CARE_PROVIDER_SITE_OTHER): Admitting: Nurse Practitioner

## 2024-02-02 ENCOUNTER — Other Ambulatory Visit: Payer: Self-pay

## 2024-02-02 VITALS — BP 132/84 | HR 64 | Temp 98.2°F | Resp 16 | Ht 65.0 in | Wt 170.0 lb

## 2024-02-02 DIAGNOSIS — E559 Vitamin D deficiency, unspecified: Secondary | ICD-10-CM | POA: Diagnosis not present

## 2024-02-02 DIAGNOSIS — K219 Gastro-esophageal reflux disease without esophagitis: Secondary | ICD-10-CM

## 2024-02-02 DIAGNOSIS — E1165 Type 2 diabetes mellitus with hyperglycemia: Secondary | ICD-10-CM | POA: Diagnosis not present

## 2024-02-02 LAB — POCT GLYCOSYLATED HEMOGLOBIN (HGB A1C): Hemoglobin A1C: 6.4 % — AB (ref 4.0–5.6)

## 2024-02-02 MED ORDER — DAPAGLIFLOZIN PROPANEDIOL 10 MG PO TABS
10.0000 mg | ORAL_TABLET | Freq: Every day | ORAL | 1 refills | Status: AC
  Filled 2024-02-02 – 2024-04-08 (×2): qty 90, 90d supply, fill #0

## 2024-02-02 MED ORDER — METFORMIN HCL ER 500 MG PO TB24
1000.0000 mg | ORAL_TABLET | Freq: Every day | ORAL | 1 refills | Status: AC
  Filled 2024-02-02 – 2024-04-08 (×2): qty 180, 90d supply, fill #0

## 2024-02-02 NOTE — Progress Notes (Signed)
 [facility] [address] [place], [place] [address]  Internal MEDICINE  Office Visit Note  Patient Name: Austin Hardin  [id number]  [id number]  Date of Service: 02/02/2024  Chief Complaint  Patient presents with   Diabetes   Follow-up    HPI Liberato presents for a follow-up visit for diabetes, GERD and low vitamin D . Diabetes -- slight increase in A1c but still stable at 6.4 GERD -- takes omeprazole  as needed Low vitamin D  -- takes OTC supplement   Current Medication: Outpatient Encounter Medications as of 02/02/2024  Medication Sig   aspirin 81 MG tablet Take 81 mg by mouth daily.   azelastine  (ASTELIN ) 0.1 % nasal spray PLACE 2 SPRAYS INTO BOTH NOSTRILS TWO TIMES DAILY AS DIRECTED   clobetasol  cream (TEMOVATE ) 0.05 % Apply 1 Application topically 2 (two) times daily. To affected area until resolved.   clotrimazole -betamethasone  (LOTRISONE ) cream Apply 1 application topically 2 (two) times daily.   cyanocobalamin  (VITAMIN B12) 1000 MCG/ML injection Inject 1 mL (1,000 mcg total) into the muscle every 30 (thirty) days.   dapagliflozin  propanediol (FARXIGA ) 10 MG TABS tablet Take 1 tablet (10 mg total) by mouth daily.   fluticasone  (FLONASE ) 50 MCG/ACT nasal spray Use 2 sprays each nostril once daily   glucose blood (FREESTYLE LITE) test strip USE AS INSTRUCTED TWICE A DAILY   glucose monitoring kit (FREESTYLE) monitoring kit 1 each by Does not apply route in the morning and at bedtime. Dx e11.65   IRON PO Take 65 mg by mouth.   metFORMIN  (GLUCOPHAGE -XR) 500 MG 24 hr tablet Take 2 tablets (1,000 mg total) by mouth daily with breakfast. May take 1 tablet in the morning and 1 tablet in the evening if desired.   nystatin  ointment (MYCOSTATIN ) Apply 1 application topically 2 (two) times daily.   omeprazole  (PRILOSEC) 40 MG capsule Take 1 capsule (40 mg total) by mouth as needed.   SYRINGE-NEEDLE, DISP, 3 ML (LUER LOCK SAFETY SYRINGES) 25G X 5/8 3 ML MISC Use as  directed once a month for B12 injection   tirzepatide  (MOUNJARO ) 12.5 MG/0.5ML Pen Inject 12.5 mg into the skin once a week.   [DISCONTINUED] dapagliflozin  propanediol (FARXIGA ) 10 MG TABS tablet Take 1 tablet (10 mg total) by mouth daily.   [DISCONTINUED] metFORMIN  (GLUCOPHAGE -XR) 500 MG 24 hr tablet Take 2 tablets (1,000 mg total) by mouth daily with breakfast. May take 1 tablet in the morning and 1 tablet in the evening if desired.   No facility-administered encounter medications on file as of 02/02/2024.    Surgical History: Past Surgical History:  Procedure Laterality Date   HERNIA REPAIR  1991    Medical History: Past Medical History:  Diagnosis Date   Allergy    environmental   Diabetes mellitus without complication (HCC)     Family History: Family History  Problem Relation Age of Onset   Diabetes Mother    Hyperlipidemia Mother    Hypertension Mother    Cancer Father    Diabetes Father    Hyperlipidemia Father    Hypertension Father    Heart disease Father    Diabetes Brother    Diabetes Brother     Social History   Socioeconomic History   Marital status: Married    Spouse name: Not on file   Number of children: Not on file   Years of education: Not on file   Highest education level: Not on file  Occupational History   Not on file  Tobacco Use  Smoking status: Never   Smokeless tobacco: Never  Vaping Use   Vaping status: Never Used  Substance and Sexual Activity   Alcohol use: No    Alcohol/week: 0.0 standard drinks of alcohol   Drug use: No   Sexual activity: Not on file  Other Topics Concern   Not on file  Social History Narrative   Not on file   Social Drivers of Health   Financial Resource Strain: Not on file  Food Insecurity: Not on file  Transportation Needs: Not on file  Physical Activity: Not on file  Stress: Not on file  Social Connections: Not on file  Intimate Partner Violence: Not on file      Review of Systems   Constitutional:  Negative for chills, fatigue and unexpected weight change.  HENT:  Negative for congestion, rhinorrhea, sneezing and sore throat.   Eyes:  Negative for redness.  Respiratory: Negative.  Negative for cough, chest tightness and shortness of breath.   Cardiovascular: Negative.  Negative for chest pain and palpitations.  Gastrointestinal:  Negative for abdominal pain, constipation, diarrhea, nausea and vomiting.  Genitourinary:  Negative for dysuria and frequency.  Musculoskeletal:  Negative for arthralgias, back pain, joint swelling and neck pain.  Skin:  Negative for rash.  Neurological: Negative.  Negative for tremors and numbness.  Hematological:  Negative for adenopathy. Does not bruise/bleed easily.  Psychiatric/Behavioral:  Negative for behavioral problems (Depression), sleep disturbance and suicidal ideas. The patient is not nervous/anxious.     Vital Signs: BP 132/84   Pulse 64   Temp 98.2 F (36.8 C)   Resp 16   Ht 5' 5 (1.651 m)   Wt 170 lb (77.1 kg)   SpO2 98%   BMI 28.29 kg/m    Physical Exam Vitals reviewed.  Constitutional:      Appearance: Normal appearance. He is obese. He is not ill-appearing.  HENT:     Head: Normocephalic and atraumatic.  Eyes:     Pupils: Pupils are equal, round, and reactive to light.  Cardiovascular:     Rate and Rhythm: Normal rate and regular rhythm.  Pulmonary:     Effort: Pulmonary effort is normal. No respiratory distress.  Skin:    Capillary Refill: Capillary refill takes less than 2 seconds.  Neurological:     Mental Status: He is alert and oriented to person, place, and time.  Psychiatric:        Mood and Affect: Mood normal.        Behavior: Behavior normal.        Assessment/Plan: 1. Type 2 diabetes mellitus with hyperglycemia, without long-term current use of insulin  (HCC) (Primary) A1c is stable, slightly increased. Continue medications as prescribed. Follow up in 4 months to repeat A1c.  - POCT  glycosylated hemoglobin (Hb A1C) - dapagliflozin  propanediol (FARXIGA ) 10 MG TABS tablet; Take 1 tablet (10 mg total) by mouth daily.  Dispense: 90 tablet; Refill: 1 - metFORMIN  (GLUCOPHAGE -XR) 500 MG 24 hr tablet; Take 2 tablets (1,000 mg total) by mouth daily with breakfast. May take 1 tablet in the morning and 1 tablet in the evening if desired.  Dispense: 180 tablet; Refill: 1  2. Vitamin D  deficiency Continue OTC supplement if desired   3. Gastroesophageal reflux disease without esophagitis Continue omeprazole  as prescribed.    General Counseling: Edgel verbalizes understanding of the findings of todays visit and agrees with plan of treatment. I have discussed any further diagnostic evaluation that may be needed or ordered today.  We also reviewed his medications today. he has been encouraged to call the office with any questions or concerns that should arise related to todays visit.    Orders Placed This Encounter  Procedures   POCT glycosylated hemoglobin (Hb A1C)    Meds ordered this encounter  Medications   dapagliflozin  propanediol (FARXIGA ) 10 MG TABS tablet    Sig: Take 1 tablet (10 mg total) by mouth daily.    Dispense:  90 tablet    Refill:  1   metFORMIN  (GLUCOPHAGE -XR) 500 MG 24 hr tablet    Sig: Take 2 tablets (1,000 mg total) by mouth daily with breakfast. May take 1 tablet in the morning and 1 tablet in the evening if desired.    Dispense:  180 tablet    Refill:  1    For future refills    Return in about 17 weeks (around 05/31/2024) for F/U, Recheck A1C, Amarachi Kotz PCP.   Total time spent:30 Minutes Time spent includes review of chart, medications, test results, and follow up plan with the patient.   [place] Controlled Substance Database was reviewed by me.  This patient was seen by Mardy Maxin, FNP-C in collaboration with Dr. Sigrid Bathe as a part of collaborative care agreement.   Elad Macphail R. Maxin, MSN, FNP-C Internal medicine

## 2024-02-06 ENCOUNTER — Encounter: Payer: Self-pay | Admitting: Nurse Practitioner

## 2024-04-08 ENCOUNTER — Other Ambulatory Visit: Payer: Self-pay

## 2024-05-28 ENCOUNTER — Encounter: Payer: Self-pay | Admitting: Nurse Practitioner

## 2024-05-28 ENCOUNTER — Other Ambulatory Visit: Payer: Self-pay

## 2024-05-28 ENCOUNTER — Ambulatory Visit: Payer: Commercial Managed Care - PPO | Admitting: Nurse Practitioner

## 2024-05-28 VITALS — BP 134/86 | HR 83 | Temp 98.0°F | Resp 16 | Ht 65.0 in | Wt 167.0 lb

## 2024-05-28 DIAGNOSIS — R300 Dysuria: Secondary | ICD-10-CM

## 2024-05-28 DIAGNOSIS — Z1211 Encounter for screening for malignant neoplasm of colon: Secondary | ICD-10-CM

## 2024-05-28 DIAGNOSIS — E538 Deficiency of other specified B group vitamins: Secondary | ICD-10-CM

## 2024-05-28 DIAGNOSIS — E785 Hyperlipidemia, unspecified: Secondary | ICD-10-CM | POA: Diagnosis not present

## 2024-05-28 DIAGNOSIS — E1169 Type 2 diabetes mellitus with other specified complication: Secondary | ICD-10-CM | POA: Insufficient documentation

## 2024-05-28 DIAGNOSIS — Z1212 Encounter for screening for malignant neoplasm of rectum: Secondary | ICD-10-CM

## 2024-05-28 DIAGNOSIS — Z0001 Encounter for general adult medical examination with abnormal findings: Secondary | ICD-10-CM | POA: Diagnosis not present

## 2024-05-28 DIAGNOSIS — Z125 Encounter for screening for malignant neoplasm of prostate: Secondary | ICD-10-CM

## 2024-05-28 DIAGNOSIS — E559 Vitamin D deficiency, unspecified: Secondary | ICD-10-CM

## 2024-05-28 DIAGNOSIS — E1165 Type 2 diabetes mellitus with hyperglycemia: Secondary | ICD-10-CM | POA: Diagnosis not present

## 2024-05-28 MED ORDER — DAPAGLIFLOZIN PROPANEDIOL 10 MG PO TABS
10.0000 mg | ORAL_TABLET | Freq: Every day | ORAL | 1 refills | Status: AC
  Filled 2024-05-28: qty 90, 90d supply, fill #0

## 2024-05-28 MED ORDER — MOUNJARO 12.5 MG/0.5ML ~~LOC~~ SOAJ
12.5000 mg | SUBCUTANEOUS | 3 refills | Status: AC
  Filled 2024-05-28 – 2024-06-15 (×3): qty 6, 84d supply, fill #0

## 2024-05-28 MED ORDER — METFORMIN HCL ER 500 MG PO TB24
1000.0000 mg | ORAL_TABLET | Freq: Every day | ORAL | 1 refills | Status: AC
  Filled 2024-05-28: qty 180, 90d supply, fill #0

## 2024-05-28 NOTE — Progress Notes (Signed)
 [facility] [address] [place], [place] [address]  Internal MEDICINE  Office Visit Note  Patient Name: Austin Hardin  [id number]  [id number]  Date of Service: 05/28/2024  Chief Complaint  Patient presents with   Diabetes   Annual Exam    HPI Austin Hardin presents for an annual well visit and physical exam.  Well-appearing 58 y.o. male with type 2 diabetes, GERD, and high cholesterol  Routine CRC screening: due now, declined colonoscopy, no family history of CRC, prefer to opt for cologuard this time.  Eye exam: due now, trying to get appt   foot exam: done  Labs: due for routine labs, will order labs today  New or worsening pain: none  Other concerns: wants to see if his B12 level is improved when he gets his labs checked.  Getting 3 month supply of mounjaro  now.  Doing well on his medications, no issues.  Due for some refills as well.     Current Medication: Outpatient Encounter Medications as of 05/28/2024  Medication Sig   aspirin 81 MG tablet Take 81 mg by mouth daily.   azelastine  (ASTELIN ) 0.1 % nasal spray PLACE 2 SPRAYS INTO BOTH NOSTRILS TWO TIMES DAILY AS DIRECTED   clobetasol  cream (TEMOVATE ) 0.05 % Apply 1 Application topically 2 (two) times daily. To affected area until resolved. (Patient not taking: Reported on 05/28/2024)   clotrimazole -betamethasone  (LOTRISONE ) cream Apply 1 application topically 2 (two) times daily.   cyanocobalamin  (VITAMIN B12) 1000 MCG/ML injection Inject 1 mL (1,000 mcg total) into the muscle every 30 (thirty) days.   dapagliflozin  propanediol (FARXIGA ) 10 MG TABS tablet Take 1 tablet (10 mg total) by mouth daily.   fluticasone  (FLONASE ) 50 MCG/ACT nasal spray Use 2 sprays each nostril once daily   glucose monitoring kit (FREESTYLE) monitoring kit 1 each by Does not apply route in the morning and at bedtime. Dx e11.65   IRON PO Take 65 mg by mouth.   metFORMIN  (GLUCOPHAGE -XR) 500 MG 24 hr tablet Take 2 tablets (1,000 mg  total) by mouth daily with breakfast. May take 1 tablet in the morning and 1 tablet in the evening if desired.   nystatin  ointment (MYCOSTATIN ) Apply 1 application topically 2 (two) times daily. (Patient not taking: Reported on 05/28/2024)   omeprazole  (PRILOSEC) 40 MG capsule Take 1 capsule (40 mg total) by mouth as needed.   SYRINGE-NEEDLE, DISP, 3 ML (LUER LOCK SAFETY SYRINGES) 25G X 5/8 3 ML MISC Use as directed once a month for B12 injection   tirzepatide  (MOUNJARO ) 12.5 MG/0.5ML Pen Inject 12.5 mg into the skin once a week.   [DISCONTINUED] dapagliflozin  propanediol (FARXIGA ) 10 MG TABS tablet Take 1 tablet (10 mg total) by mouth daily.   [DISCONTINUED] metFORMIN  (GLUCOPHAGE -XR) 500 MG 24 hr tablet Take 2 tablets (1,000 mg total) by mouth daily with breakfast. May take 1 tablet in the morning and 1 tablet in the evening if desired.   [DISCONTINUED] tirzepatide  (MOUNJARO ) 12.5 MG/0.5ML Pen Inject 12.5 mg into the skin once a week.   No facility-administered encounter medications on file as of 05/28/2024.    Surgical History: Past Surgical History:  Procedure Laterality Date   HERNIA REPAIR  1991    Medical History: Past Medical History:  Diagnosis Date   Allergy    environmental   Diabetes mellitus without complication (HCC)     Family History: Family History  Problem Relation Age of Onset   Diabetes Mother    Hyperlipidemia Mother    Hypertension  Mother    Cancer Father    Diabetes Father    Hyperlipidemia Father    Hypertension Father    Heart disease Father    Diabetes Brother    Diabetes Brother     Social History   Socioeconomic History   Marital status: Married    Spouse name: Not on file   Number of children: Not on file   Years of education: Not on file   Highest education level: Not on file  Occupational History   Not on file  Tobacco Use   Smoking status: Never   Smokeless tobacco: Never  Vaping Use   Vaping status: Never Used  Substance and  Sexual Activity   Alcohol use: No    Alcohol/week: 0.0 standard drinks of alcohol   Drug use: No   Sexual activity: Not on file  Other Topics Concern   Not on file  Social History Narrative   Not on file   Social Drivers of Health   Tobacco Use: Low Risk (05/28/2024)   Patient History    Smoking Tobacco Use: Never    Smokeless Tobacco Use: Never    Passive Exposure: Not on file  Financial Resource Strain: Not on file  Food Insecurity: Not on file  Transportation Needs: Not on file  Physical Activity: Not on file  Stress: Not on file  Social Connections: Not on file  Intimate Partner Violence: Not on file  Depression (PHQ2-9): Low Risk (05/28/2024)   Depression (PHQ2-9)    PHQ-2 Score: 0  Alcohol Screen: Low Risk (01/03/2022)   Alcohol Screen    Last Alcohol Screening Score (AUDIT): 0  Housing: Not on file  Utilities: Not on file  Health Literacy: Not on file      Review of Systems  Constitutional:  Negative for activity change, appetite change, chills, fatigue, fever and unexpected weight change.  HENT: Negative.  Negative for congestion, ear pain, rhinorrhea, sore throat and trouble swallowing.   Eyes: Negative.   Respiratory: Negative.  Negative for cough, chest tightness, shortness of breath and wheezing.   Cardiovascular: Negative.  Negative for chest pain.  Gastrointestinal: Negative.  Negative for abdominal pain, blood in stool, constipation, diarrhea, nausea and vomiting.  Endocrine: Negative.   Genitourinary: Negative.  Negative for difficulty urinating, dysuria, frequency, hematuria and urgency.  Musculoskeletal: Negative.  Negative for arthralgias, back pain, joint swelling, myalgias and neck pain.  Skin: Negative.  Negative for rash and wound.  Allergic/Immunologic: Negative.  Negative for immunocompromised state.  Neurological: Negative.  Negative for dizziness, seizures, numbness and headaches.  Hematological: Negative.   Psychiatric/Behavioral:  Negative.  Negative for behavioral problems, self-injury and suicidal ideas. The patient is not nervous/anxious.     Vital Signs: BP 134/86   Pulse 83   Temp 98 F (36.7 C)   Resp 16   Ht 5' 5 (1.651 m)   Wt 167 lb (75.8 kg) Comment: weight on scale at home this morning  SpO2 97%   BMI 27.79 kg/m    Physical Exam Vitals reviewed.  Constitutional:      General: He is awake. He is not in acute distress.    Appearance: Normal appearance. He is well-developed and well-groomed. He is obese. He is not ill-appearing or diaphoretic.  HENT:     Head: Normocephalic and atraumatic.     Right Ear: Tympanic membrane, ear canal and external ear normal.     Left Ear: Tympanic membrane, ear canal and external ear normal.  Nose: Nose normal. No congestion or rhinorrhea.     Mouth/Throat:     Lips: Pink.     Mouth: Mucous membranes are moist.     Pharynx: Oropharynx is clear. Uvula midline. No oropharyngeal exudate or posterior oropharyngeal erythema.  Eyes:     General: Lids are normal. Vision grossly intact. Gaze aligned appropriately. No scleral icterus.       Right eye: No discharge.        Left eye: No discharge.     Extraocular Movements: Extraocular movements intact.     Conjunctiva/sclera: Conjunctivae normal.     Pupils: Pupils are equal, round, and reactive to light.     Funduscopic exam:    Right eye: Red reflex present.        Left eye: Red reflex present. Neck:     Thyroid : No thyromegaly.     Vascular: No JVD.     Trachea: No tracheal deviation.  Cardiovascular:     Rate and Rhythm: Normal rate and regular rhythm.     Pulses: Normal pulses.          Dorsalis pedis pulses are 2+ on the right side and 2+ on the left side.       Posterior tibial pulses are 2+ on the right side and 2+ on the left side.     Heart sounds: Normal heart sounds, S1 normal and S2 normal. No murmur heard.    No friction rub. No gallop.  Pulmonary:     Effort: Pulmonary effort is normal. No  accessory muscle usage or respiratory distress.     Breath sounds: Normal breath sounds and air entry. No stridor. No wheezing or rales.  Chest:     Chest wall: No tenderness.  Abdominal:     General: Bowel sounds are normal. There is no distension.     Palpations: Abdomen is soft. There is no shifting dullness, fluid wave, mass or pulsatile mass.     Tenderness: There is no abdominal tenderness. There is no guarding or rebound.  Musculoskeletal:        General: No tenderness or deformity. Normal range of motion.     Cervical back: Normal range of motion and neck supple.     Right lower leg: No edema.     Left lower leg: No edema.     Right foot: Normal range of motion. No deformity, bunion, Charcot foot, foot drop or prominent metatarsal heads.     Left foot: Normal range of motion. No deformity, bunion, Charcot foot, foot drop or prominent metatarsal heads.  Feet:     Right foot:     Protective Sensation: 6 sites tested.  6 sites sensed.     Skin integrity: Dry skin present. No ulcer, blister, skin breakdown, erythema, warmth, callus or fissure.     Toenail Condition: Right toenails are abnormally thick and long.     Left foot:     Protective Sensation: 6 sites tested.  6 sites sensed.     Skin integrity: Dry skin present. No ulcer, blister, skin breakdown, erythema, warmth, callus or fissure.     Toenail Condition: Left toenails are abnormally thick and long.  Lymphadenopathy:     Cervical: No cervical adenopathy.  Skin:    General: Skin is warm and dry.     Capillary Refill: Capillary refill takes less than 2 seconds.     Coloration: Skin is not pale.     Findings: No erythema or rash.  Neurological:  Mental Status: He is alert and oriented to person, place, and time.     Cranial Nerves: No cranial nerve deficit.     Motor: No abnormal muscle tone.     Coordination: Coordination normal.     Deep Tendon Reflexes: Reflexes are normal and symmetric.  Psychiatric:        Mood  and Affect: Mood and affect normal.        Behavior: Behavior normal. Behavior is cooperative.        Thought Content: Thought content normal.        Judgment: Judgment normal.        Assessment/Plan: 1. Encounter for routine adult health examination with abnormal findings (Primary) Age-appropriate preventive screenings and vaccinations discussed, annual physical exam completed. Routine labs for health maintenance ordered, see below. PHM updated.   - CBC with Differential/Platelet - CMP14+EGFR - Lipid Profile - Hgb A1C w/o eAG - B12 and Folate Panel - Vitamin D  (25 hydroxy) - Urine Microalbumin w/creat. ratio - PSA Total (Reflex To Free) - UA/M w/rflx Culture, Routine  2. Type 2 diabetes mellitus with hyperglycemia, without long-term current use of insulin  (HCC) Continue farxiga , mounjaro  and metformin  as prescribed. Routine labs ordered. Urine sent for ACR.  - tirzepatide  (MOUNJARO ) 12.5 MG/0.5ML Pen; Inject 12.5 mg into the skin once a week.  Dispense: 6 mL; Refill: 3 - dapagliflozin  propanediol (FARXIGA ) 10 MG TABS tablet; Take 1 tablet (10 mg total) by mouth daily.  Dispense: 90 tablet; Refill: 1 - metFORMIN  (GLUCOPHAGE -XR) 500 MG 24 hr tablet; Take 2 tablets (1,000 mg total) by mouth daily with breakfast. May take 1 tablet in the morning and 1 tablet in the evening if desired.  Dispense: 180 tablet; Refill: 1 - Urine Microalbumin w/creat. ratio  3. Hyperlipidemia associated with type 2 diabetes mellitus (HCC) Routine labs ordered  - CBC with Differential/Platelet - CMP14+EGFR - Lipid Profile - Hgb A1C w/o eAG  4. B12 deficiency Routine labs ordered  - CBC with Differential/Platelet - B12 and Folate Panel  5. Vitamin D  deficiency Routine lab ordered  - Vitamin D  (25 hydroxy)  6. Dysuria Urine sent to lab - UA/M w/rflx Culture, Routine  7. Screening for colorectal cancer Cologuard test ordered  - Cologuard  8. Screening for prostate cancer PSA lab ordered  -  PSA Total (Reflex To Free)     General Counseling: Austin Hardin verbalizes understanding of the findings of todays visit and agrees with plan of treatment. I have discussed any further diagnostic evaluation that may be needed or ordered today. We also reviewed his medications today. he has been encouraged to call the office with any questions or concerns that should arise related to todays visit.    Orders Placed This Encounter  Procedures   CBC with Differential/Platelet   CMP14+EGFR   Lipid Profile   Hgb A1C w/o eAG   B12 and Folate Panel   Vitamin D  (25 hydroxy)   Cologuard   Urine Microalbumin w/creat. ratio   PSA Total (Reflex To Free)    Meds ordered this encounter  Medications   tirzepatide  (MOUNJARO ) 12.5 MG/0.5ML Pen    Sig: Inject 12.5 mg into the skin once a week.    Dispense:  6 mL    Refill:  3    90 days supply   dapagliflozin  propanediol (FARXIGA ) 10 MG TABS tablet    Sig: Take 1 tablet (10 mg total) by mouth daily.    Dispense:  90 tablet    Refill:  1   metFORMIN  (GLUCOPHAGE -XR) 500 MG 24 hr tablet    Sig: Take 2 tablets (1,000 mg total) by mouth daily with breakfast. May take 1 tablet in the morning and 1 tablet in the evening if desired.    Dispense:  180 tablet    Refill:  1    For future refills    Return in about 4 months (around 09/26/2024) for F/U, Recheck A1C, Austin Hardin PCP please cancel appt next monday. .   Total time spent:30 Minutes Time spent includes review of chart, medications, test results, and follow up plan with the patient.   [place] Controlled Substance Database was reviewed by me.  This patient was seen by Mardy Maxin, FNP-C in collaboration with Dr. Sigrid Bathe as a part of collaborative care agreement.  Reynol Arnone R. Maxin, MSN, FNP-C Internal medicine

## 2024-05-29 LAB — UA/M W/RFLX CULTURE, ROUTINE
Bilirubin, UA: NEGATIVE
Ketones, UA: NEGATIVE
Leukocytes,UA: NEGATIVE
Nitrite, UA: NEGATIVE
Protein,UA: NEGATIVE
RBC, UA: NEGATIVE
Urobilinogen, Ur: 0.2 mg/dL (ref 0.2–1.0)
pH, UA: 7 (ref 5.0–7.5)

## 2024-05-29 LAB — MICROSCOPIC EXAMINATION
Bacteria, UA: NONE SEEN
Casts: NONE SEEN /LPF
Epithelial Cells (non renal): NONE SEEN /HPF (ref 0–10)
RBC, Urine: NONE SEEN /HPF (ref 0–2)
WBC, UA: NONE SEEN /HPF (ref 0–5)

## 2024-05-29 LAB — MICROALBUMIN / CREATININE URINE RATIO
Creatinine, Urine: 45.4 mg/dL
Microalb/Creat Ratio: <7 mg/g{creat} (ref 0–29)
Microalbumin, Urine: <3 ug/mL

## 2024-05-31 ENCOUNTER — Ambulatory Visit: Admitting: Nurse Practitioner

## 2024-05-31 ENCOUNTER — Other Ambulatory Visit
Admission: RE | Admit: 2024-05-31 | Discharge: 2024-05-31 | Disposition: A | Discharge status: Home / Self Care | Source: Ambulatory Visit | Attending: Nurse Practitioner | Admitting: Nurse Practitioner

## 2024-05-31 DIAGNOSIS — E559 Vitamin D deficiency, unspecified: Secondary | ICD-10-CM | POA: Insufficient documentation

## 2024-05-31 DIAGNOSIS — E782 Mixed hyperlipidemia: Secondary | ICD-10-CM | POA: Insufficient documentation

## 2024-05-31 DIAGNOSIS — E538 Deficiency of other specified B group vitamins: Secondary | ICD-10-CM | POA: Insufficient documentation

## 2024-05-31 DIAGNOSIS — Z125 Encounter for screening for malignant neoplasm of prostate: Secondary | ICD-10-CM | POA: Insufficient documentation

## 2024-05-31 DIAGNOSIS — Z0001 Encounter for general adult medical examination with abnormal findings: Secondary | ICD-10-CM | POA: Diagnosis not present

## 2024-05-31 LAB — CBC WITH DIFFERENTIAL/PLATELET
Abs Immature Granulocytes: 0.02 K/uL (ref 0.00–0.07)
Basophils Absolute: 0 K/uL (ref 0.0–0.1)
Basophils Relative: 1 %
Eosinophils Absolute: 0.3 K/uL (ref 0.0–0.5)
Eosinophils Relative: 5 %
HCT: 47.8 % (ref 39.0–52.0)
Hemoglobin: 15.2 g/dL (ref 13.0–17.0)
Immature Granulocytes: 0 %
Lymphocytes Relative: 29 %
Lymphs Abs: 1.9 K/uL (ref 0.7–4.0)
MCH: 26.2 pg (ref 26.0–34.0)
MCHC: 31.8 g/dL (ref 30.0–36.0)
MCV: 82.3 fL (ref 80.0–100.0)
Monocytes Absolute: 0.6 K/uL (ref 0.1–1.0)
Monocytes Relative: 9 %
Neutro Abs: 3.7 K/uL (ref 1.7–7.7)
Neutrophils Relative %: 56 %
Platelets: 277 K/uL (ref 150–400)
RBC: 5.81 MIL/uL (ref 4.22–5.81)
RDW: 14 % (ref 11.5–15.5)
WBC: 6.6 K/uL (ref 4.0–10.5)
nRBC: 0 % (ref 0.0–0.2)

## 2024-05-31 LAB — COMPREHENSIVE METABOLIC PANEL WITH GFR
ALT: 29 U/L (ref 0–44)
AST: 28 U/L (ref 15–41)
Albumin: 4.2 g/dL (ref 3.5–5.0)
Alkaline Phosphatase: 81 U/L (ref 38–126)
Anion gap: 14 (ref 5–15)
BUN: 9 mg/dL (ref 6–20)
CO2: 25 mmol/L (ref 22–32)
Calcium: 9.6 mg/dL (ref 8.9–10.3)
Chloride: 102 mmol/L (ref 98–111)
Creatinine, Ser: 0.62 mg/dL (ref 0.61–1.24)
GFR, Estimated: >60 mL/min
Glucose, Bld: 100 mg/dL — ABNORMAL HIGH (ref 70–99)
Potassium: 4.2 mmol/L (ref 3.5–5.1)
Sodium: 142 mmol/L (ref 135–145)
Total Bilirubin: 0.5 mg/dL (ref 0.0–1.2)
Total Protein: 7.2 g/dL (ref 6.5–8.1)

## 2024-05-31 LAB — FOLATE: Folate: 6.7 ng/mL

## 2024-05-31 LAB — HEMOGLOBIN A1C
Hgb A1c MFr Bld: 6.3 % — ABNORMAL HIGH (ref 4.8–5.6)
Mean Plasma Glucose: 134.11 mg/dL

## 2024-05-31 LAB — LIPID PANEL
Cholesterol: 115 mg/dL (ref 0–200)
HDL: 58 mg/dL
LDL Cholesterol: 41 mg/dL (ref 0–99)
Total CHOL/HDL Ratio: 2 ratio
Triglycerides: 82 mg/dL
VLDL: 16 mg/dL (ref 0–40)

## 2024-05-31 LAB — VITAMIN D 25 HYDROXY (VIT D DEFICIENCY, FRACTURES): Vit D, 25-Hydroxy: 17.9 ng/mL — ABNORMAL LOW (ref 30–100)

## 2024-06-01 LAB — PSA (REFLEX TO FREE) (SERIAL): Prostate Specific Ag, Serum: 0.7 ng/mL (ref 0.0–4.0)

## 2024-06-01 LAB — VITAMIN B12: Vitamin B-12: 461 pg/mL (ref 180–914)

## 2024-06-13 LAB — COLOGUARD: COLOGUARD: NEGATIVE

## 2024-06-15 ENCOUNTER — Other Ambulatory Visit (HOSPITAL_COMMUNITY): Payer: Self-pay

## 2024-06-15 ENCOUNTER — Other Ambulatory Visit: Payer: Self-pay

## 2024-09-27 ENCOUNTER — Ambulatory Visit: Admitting: Nurse Practitioner

## 2025-05-31 ENCOUNTER — Encounter: Admitting: Nurse Practitioner
